# Patient Record
Sex: Female | Born: 1950 | Race: White | Hispanic: No | Marital: Single
Health system: Midwestern US, Academic
[De-identification: ages and names within clinical notes are randomized; demographics above are authoritative.]

---

## 2018-12-16 ENCOUNTER — Encounter: Admit: 2018-12-16 | Discharge: 2018-12-17 | Payer: MEDICARE

## 2018-12-16 DIAGNOSIS — R69 Illness, unspecified: Secondary | ICD-10-CM

## 2019-01-14 ENCOUNTER — Encounter: Admit: 2019-01-14 | Discharge: 2019-01-14 | Payer: MEDICARE

## 2019-01-15 ENCOUNTER — Encounter: Admit: 2019-01-15 | Discharge: 2019-01-15 | Payer: MEDICARE

## 2019-01-15 NOTE — Telephone Encounter
Pre Visit Planning- New Patient    Records received: Yes    Orders have been NA    Patient active in MyChart. No appointment reminder sent. .     Left voicemail message.    Updated chart: Not assessed    MRI and X-ray PACS

## 2019-01-20 ENCOUNTER — Encounter: Admit: 2019-01-20 | Discharge: 2019-01-20 | Payer: MEDICARE

## 2019-01-20 ENCOUNTER — Ambulatory Visit: Admit: 2019-01-20 | Discharge: 2019-01-21 | Payer: MEDICARE

## 2019-01-20 NOTE — Progress Notes
Date of Service: 01/20/2019        Chief complaint    Chief Complaint   Patient presents with   ? Lower Back - Pain   ? Left Leg - Numbness, Pain   ? Right Leg - Numbness, Pain   ? New Patient     muscle stifness, trouble walking and going up and down the stairs       HPI  Chelsea Roach is a 68 y.o. female who presents with a history of progressive back and leg pain. She reports back and leg pain for several years. She reports that the pain in her back is worse with walking and relieved with rest. She complains of bilateral leg pain. She has cramping in her posterior thighs and calves with walking and at night. She reports that the pain is a dull ache. She also complains of difficulty walking up flights of stairs and says that her right leg/knee buckles while walking.     Patient takes tylenol/NSAIDs with mild relief of neck pain.   Patient has tried physical therapy with mild, but temporary relief of neck pain.   Patient does not have a history of diabetes. Patient does not smoke.  Patient does not have a history of cancer/chemotherapy. Patient does not have a history of peripheral arterial disease.   Patient has a BMI of Body mass index is 42.57 kg/m?Marland Kitchen    PMH    Medical History:   Diagnosis Date   ? Lymphedema    ? Spinal stenosis            ROS  Review of Systems   Constitutional: Positive for fatigue.   HENT: Negative.    Eyes: Negative.    Respiratory: Positive for shortness of breath.    Cardiovascular: Negative.    Gastrointestinal: Positive for abdominal pain.   Endocrine: Negative.    Genitourinary: Positive for enuresis.   Musculoskeletal: Positive for arthralgias, back pain and myalgias.   Skin: Negative.    Allergic/Immunologic: Negative.    Neurological: Positive for weakness and numbness.   Hematological: Negative.    Psychiatric/Behavioral: Positive for sleep disturbance.          FH    Family History   Problem Relation Age of Onset   ? Cancer-Breast Mother      Social History Socioeconomic History   ? Marital status: Single     Spouse name: Not on file   ? Number of children: Not on file   ? Years of education: Not on file   ? Highest education level: Not on file   Occupational History   ? Not on file   Tobacco Use   ? Smoking status: Never Smoker   ? Smokeless tobacco: Never Used   Substance and Sexual Activity   ? Alcohol use: Yes     Alcohol/week: 1.0 standard drinks     Types: 1 Glasses of wine per week     Frequency: 2-3 times a week     Binge frequency: Weekly   ? Drug use: Never   ? Sexual activity: Not Currently   Other Topics Concern   ? Not on file   Social History Narrative   ? Not on file           [facility]    Surgical History:   Procedure Laterality Date   ? CESAREAN SECTION  1991   ? 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 REVISION TOTAL KNEE ARTHROPLASTY Right 06/2018   ?  CHOLECYSTECTOMY         Meds    ? liothyronine (CYTOMEL) 5 mcg tab Take 5 mcg by mouth twice daily.   ? SYNTHROID 125 mcg tablet Take 125 mcg by mouth daily.   ? thiamine (VITAMIN B-1) 100 mg/mL injection INJECT 100MG  INTRAMUSCULARLY EVERY 3 WEEKS       Allergies    Allergies   Allergen Reactions   ? Keflex [Cephalexin] SEE COMMENTS     Patient states tightening in throat   ? Pcn [Penicillins] SEE COMMENTS     Patient states tightening in throat     ? Peanut UNKNOWN and SEE COMMENTS     Skin breaks out   ? Shellfish Containing Products NAUSEA AND VOMITING   ? Wheat UNKNOWN         Exam  Physical Exam   Deferred due to telehealth    Vitals:   Vitals:    01/20/19 1329   PainSc: Seven     There is no height or weight on file to calculate BMI.      Imaging:  No results found for this or any previous visit.  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 Risks/benefits/alternatives to surgery discussed with the patient: L3-5 decompression and fusion  Given her spondylolisthesis at L3/4 and complaint of significant LBP she will need stabilization of her spine  Pt with BMI of 43, consideration of XLIF will depend on body habitus  Recommend trial of conservative treatment with injections at L3/4 and L4/5  RTC in 6 weeks    The risks and benefits of surgery were explained in detail to the patient which included, but certainly were not limited to: bleeding, infection, nerve or vessel damage, scar, pain, risks of anesthesia, death, need for further surgery, iatrogenic instability, epidural hematoma, failure of implants and hardware, misplacement of implants and hardware, migration of implants and hardware, bladder injury, bowel injury, heart attack, stroke, massive bleeding, death, deep venous thrombosis, pulmonary embolism, spinal cord and nerve damage, reflex sympathetic dystrophy, sexual dysfunction, positioning problems, brachial plexus injuries, traction injuries, swallowing difficulties, problems with vocal cords, airway obstruction, post operative swelling, need for prolonged intubation, and persistent dural fistula, need for further surgery, paralysis, blindness, possible wrong level surgery, no relief of current symptoms, possible development of new symptoms, possible worsening of symptoms, possible need for intraoperative change of procedure, possible need for fusion of the spine as determined intraoperatively, and other rare risks not named above. The patient also understands that spine fellows and neurosurgery residents will assist with the surgery. The risks, benefits, alternatives and risk of the alternatives were discussed with the patient. The patient understands the risks of the procedure and elect to proceed.      Plan                                Encounter Medications   Medications   ? DISCONTD: BD LUER-LOK SYRINGE 3 mL 25 gauge x 1 syrg Sig: USE ONE SYRINGE ONCE EVERY 3 WEEKS TO INJECT THIAMINE. USE NEW SYRINGE FOR EACH INJECTION.   ? SYNTHROID 125 mcg tablet     Sig: Take 125 mcg by mouth daily.   ? liothyronine (CYTOMEL) 5 mcg  tab     Sig: Take 5 mcg by mouth twice daily.   ? thiamine (VITAMIN B-1) 100 mg/mL injection     Sig: INJECT 100MG  INTRAMUSCULARLY EVERY 3 WEEKS

## 2019-01-20 NOTE — Patient Instructions
Kodee Drury RN, BSN  Clinical Nurse Coordinator-[facility] - [facility]  [facility] - Neurosurgery- Dr. Justin Davis & Dr Ifije Ohiorhenuan  Ph: [phone] - Fax: [phone] - Email:[email or web address]

## 2019-06-10 ENCOUNTER — Encounter: Admit: 2019-06-10 | Discharge: 2019-06-10 | Payer: MEDICARE

## 2019-07-14 ENCOUNTER — Ambulatory Visit: Admit: 2019-07-14 | Discharge: 2019-07-14 | Payer: MEDICARE

## 2019-07-14 ENCOUNTER — Encounter: Admit: 2019-07-14 | Discharge: 2019-07-14 | Payer: MEDICARE

## 2019-07-14 DIAGNOSIS — M4316 Spondylolisthesis, lumbar region: Secondary | ICD-10-CM

## 2019-07-14 DIAGNOSIS — Z20822 Encounter for screening laboratory testing for COVID-19 virus in asymptomatic patient: Secondary | ICD-10-CM

## 2019-07-14 DIAGNOSIS — M4800 Spinal stenosis, site unspecified: Secondary | ICD-10-CM

## 2019-07-14 DIAGNOSIS — I890 Lymphedema, not elsewhere classified: Secondary | ICD-10-CM

## 2019-07-14 DIAGNOSIS — M4306 Spondylolysis, lumbar region: Secondary | ICD-10-CM

## 2019-07-15 ENCOUNTER — Encounter: Admit: 2019-07-15 | Discharge: 2019-07-15 | Payer: MEDICARE

## 2019-07-16 ENCOUNTER — Encounter: Admit: 2019-07-16 | Discharge: 2019-07-16 | Payer: MEDICARE

## 2019-07-16 NOTE — Telephone Encounter
This RN received call from [facility] and the lab states that they do not do COVID PCR testa from an outside ordering Provider. This RN called and spoke to Revonda Standard, Engineer, civil (consulting) at [facility], PA's office and she recommended this RN call patient and request that she call and schedule an in-person appointment for a COVID PCR test and have them fax results to our fax #[phone].   This RN called patient and explained above to her the above and provided her with the above fax # to have results faxed to; This RN informed patient that she needs to get her COVID PCR test on 08/20/2019; as well as informing patient that her Telehealth PAT Appointment is on 07/27/19 at 1130patient verbalized understanding and denies any questions, concerns or needs at this time.

## 2019-07-28 ENCOUNTER — Encounter: Admit: 2019-07-28 | Discharge: 2019-07-28 | Payer: MEDICARE

## 2019-07-28 DIAGNOSIS — G4733 Obstructive sleep apnea (adult) (pediatric): Secondary | ICD-10-CM

## 2019-07-28 DIAGNOSIS — M4800 Spinal stenosis, site unspecified: Secondary | ICD-10-CM

## 2019-07-28 DIAGNOSIS — I890 Lymphedema, not elsewhere classified: Secondary | ICD-10-CM

## 2019-07-28 DIAGNOSIS — E039 Hypothyroidism, unspecified: Secondary | ICD-10-CM

## 2019-07-29 ENCOUNTER — Ambulatory Visit: Admit: 2019-07-29 | Discharge: 2019-07-30 | Payer: MEDICARE

## 2019-07-29 ENCOUNTER — Encounter: Admit: 2019-07-29 | Discharge: 2019-07-29 | Payer: MEDICARE

## 2019-07-29 DIAGNOSIS — Z01818 Encounter for other preprocedural examination: Secondary | ICD-10-CM

## 2019-07-29 DIAGNOSIS — I890 Lymphedema, not elsewhere classified: Secondary | ICD-10-CM

## 2019-07-29 DIAGNOSIS — G4733 Obstructive sleep apnea (adult) (pediatric): Secondary | ICD-10-CM

## 2019-07-29 DIAGNOSIS — M4800 Spinal stenosis, site unspecified: Secondary | ICD-10-CM

## 2019-07-29 DIAGNOSIS — E039 Hypothyroidism, unspecified: Secondary | ICD-10-CM

## 2019-07-29 DIAGNOSIS — Z8719 Personal history of other diseases of the digestive system: Secondary | ICD-10-CM

## 2019-07-29 NOTE — Progress Notes
A telemedicine encounter was created for the patient. Patient consents to the telehealth visit.  Nursing and surgery history were obtained.  Patient was given surgery and soap instructions and instructions were sent via My Chart.  COVID19 visitor policy was reviewed.  Patient verbalized understanding.

## 2019-08-24 MED ORDER — HYDROMORPHONE (PF) 2 MG/ML IJ SYRG
.5 mg | INTRAVENOUS | 0 refills | Status: DC | PRN
Start: 2019-08-24 — End: 2019-08-25
  Administered 2019-08-25: 0.5 mg via INTRAVENOUS

## 2019-08-24 MED ORDER — SODIUM CHLORIDE 0.9 % IV SOLP
1000 mL | INTRAVENOUS | 0 refills | Status: DC
Start: 2019-08-24 — End: 2019-08-25

## 2019-08-24 MED ORDER — MELATONIN 5 MG PO TAB
5 mg | Freq: Every evening | ORAL | 0 refills | Status: DC
Start: 2019-08-24 — End: 2019-08-27
  Administered 2019-08-25 – 2019-08-27 (×3): 5 mg via ORAL

## 2019-08-24 MED ORDER — KETAMINE 10 MG/ML IJ SOLN (INFUSION)(AM)(OR)
0 refills | Status: DC
Start: 2019-08-24 — End: 2019-08-24
  Administered 2019-08-24: 16:00:00 200 ug/kg/h via INTRAVENOUS

## 2019-08-24 MED ORDER — CEFAZOLIN INJ 1GM IVP
2 g | INTRAVENOUS | 0 refills | Status: CP
Start: 2019-08-24 — End: ?
  Administered 2019-08-25 (×3): 2 g via INTRAVENOUS

## 2019-08-24 MED ORDER — EPINEPHRINE 0.1MG NS 10ML SYR (AM)(OR)
0 refills | Status: DC
Start: 2019-08-24 — End: 2019-08-24
  Administered 2019-08-24 (×2): 10 ug via INTRAVENOUS

## 2019-08-24 MED ORDER — LIDOCAINE (PF) 10 MG/ML (1 %) IJ SOLN
.1-2 mL | INTRAMUSCULAR | 0 refills | Status: DC | PRN
Start: 2019-08-24 — End: 2019-08-25

## 2019-08-24 MED ORDER — ROCURONIUM 10 MG/ML IV SOLN
INTRAVENOUS | 0 refills | Status: DC
Start: 2019-08-24 — End: 2019-08-24
  Administered 2019-08-24: 16:00:00 50 mg via INTRAVENOUS

## 2019-08-24 MED ORDER — CETIRIZINE 10 MG PO TAB
10 mg | Freq: Every evening | ORAL | 0 refills | Status: DC
Start: 2019-08-24 — End: 2019-08-27
  Administered 2019-08-25 – 2019-08-27 (×3): 10 mg via ORAL

## 2019-08-24 MED ORDER — SUFENTANIL 100 MCG IN NS 10 ML (OR)
0 refills | Status: DC
Start: 2019-08-24 — End: 2019-08-24
  Administered 2019-08-24 (×2): 0.2 ug/kg/h via INTRAVENOUS

## 2019-08-24 MED ORDER — METHOCARBAMOL 750 MG PO TAB
750 mg | Freq: Three times a day (TID) | ORAL | 0 refills | Status: DC
Start: 2019-08-24 — End: 2019-08-27
  Administered 2019-08-25 – 2019-08-27 (×9): 750 mg via ORAL

## 2019-08-24 MED ORDER — CEFAZOLIN 1 GRAM IJ SOLR
0 refills | Status: DC
Start: 2019-08-24 — End: 2019-08-24
  Administered 2019-08-24 (×2): 3 g via INTRAVENOUS

## 2019-08-24 MED ORDER — ALBUMIN, HUMAN 5 % 500 ML IV SOLP (AN)(OSM)
0 refills | Status: DC
Start: 2019-08-24 — End: 2019-08-24
  Administered 2019-08-24 (×2): via INTRAVENOUS

## 2019-08-24 MED ORDER — ACETAMINOPHEN 1,000 MG/100 ML (10 MG/ML) IV SOLN
0 refills | Status: DC
Start: 2019-08-24 — End: 2019-08-24
  Administered 2019-08-24: 22:00:00 1000 mg via INTRAVENOUS

## 2019-08-24 MED ORDER — PROMETHAZINE 25 MG/ML IJ SOLN
6.25 mg | INTRAVENOUS | 0 refills | Status: DC | PRN
Start: 2019-08-24 — End: 2019-08-25

## 2019-08-24 MED ORDER — VANCOMYCIN 1,000 MG IV SOLR
0 refills | Status: DC
Start: 2019-08-24 — End: 2019-08-24
  Administered 2019-08-24: 21:00:00 1 g via TOPICAL

## 2019-08-24 MED ORDER — SENNOSIDES-DOCUSATE SODIUM 8.6-50 MG PO TAB
1 | Freq: Two times a day (BID) | ORAL | 0 refills | Status: DC
Start: 2019-08-24 — End: 2019-08-27
  Administered 2019-08-25 – 2019-08-27 (×6): 1 via ORAL

## 2019-08-24 MED ORDER — DIAZEPAM 5 MG PO TAB
2.5-5 mg | ORAL | 0 refills | Status: DC | PRN
Start: 2019-08-24 — End: 2019-08-27

## 2019-08-24 MED ORDER — FENTANYL CITRATE (PF) 50 MCG/ML IJ SOLN
50 ug | INTRAVENOUS | 0 refills | Status: DC | PRN
Start: 2019-08-24 — End: 2019-08-25
  Administered 2019-08-24 – 2019-08-25 (×3): 50 ug via INTRAVENOUS

## 2019-08-24 MED ORDER — ACETAMINOPHEN 500 MG PO TAB
1000 mg | Freq: Once | ORAL | 0 refills | Status: DC
Start: 2019-08-24 — End: 2019-08-25

## 2019-08-24 MED ORDER — EPHEDRINE SULFATE 50 MG/ML IV SOLN
0 refills | Status: DC
Start: 2019-08-24 — End: 2019-08-24
  Administered 2019-08-24: 19:00:00 5 mg via INTRAVENOUS
  Administered 2019-08-24: 16:00:00 20 mg via INTRAVENOUS

## 2019-08-24 MED ORDER — FENTANYL CITRATE (PF) 50 MCG/ML IJ SOLN
0 refills | Status: DC
Start: 2019-08-24 — End: 2019-08-24
  Administered 2019-08-24: 16:00:00 100 ug via INTRAVENOUS

## 2019-08-24 MED ORDER — LEVOTHYROXINE 125 MCG PO TAB
125 ug | Freq: Every day | ORAL | 0 refills | Status: DC
Start: 2019-08-24 — End: 2019-08-27
  Administered 2019-08-25 – 2019-08-27 (×3): 125 ug via ORAL

## 2019-08-24 MED ORDER — FENTANYL CITRATE (PF) 50 MCG/ML IJ SOLN
25-50 ug | INTRAVENOUS | 0 refills | Status: DC | PRN
Start: 2019-08-24 — End: 2019-08-26

## 2019-08-24 MED ORDER — LIDOCAINE (PF) 200 MG/10 ML (2 %) IJ SYRG
0 refills | Status: DC
Start: 2019-08-24 — End: 2019-08-24
  Administered 2019-08-24: 16:00:00 80 mg via INTRAVENOUS

## 2019-08-24 MED ORDER — DOCUSATE SODIUM 100 MG PO CAP
100 mg | Freq: Two times a day (BID) | ORAL | 0 refills | Status: DC
Start: 2019-08-24 — End: 2019-08-27
  Administered 2019-08-25 – 2019-08-27 (×6): 100 mg via ORAL

## 2019-08-24 MED ORDER — MAGNESIUM HYDROXIDE 2,400 MG/10 ML PO SUSP
10 mL | Freq: Every day | ORAL | 0 refills | Status: DC
Start: 2019-08-24 — End: 2019-08-27
  Administered 2019-08-25 – 2019-08-27 (×3): 10 mL via ORAL

## 2019-08-24 MED ORDER — DEXAMETHASONE SODIUM PHOSPHATE 4 MG/ML IJ SOLN
INTRAVENOUS | 0 refills | Status: DC
Start: 2019-08-24 — End: 2019-08-24
  Administered 2019-08-24: 16:00:00 4 mg via INTRAVENOUS

## 2019-08-24 MED ORDER — OXYCODONE 5 MG PO TAB
5-10 mg | Freq: Once | ORAL | 0 refills | Status: DC | PRN
Start: 2019-08-24 — End: 2019-08-25

## 2019-08-24 MED ORDER — ELECTROLYTE-A IV SOLP
0 refills | Status: DC
Start: 2019-08-24 — End: 2019-08-24
  Administered 2019-08-24 (×3): via INTRAVENOUS

## 2019-08-24 MED ORDER — ONDANSETRON HCL (PF) 4 MG/2 ML IJ SOLN
INTRAVENOUS | 0 refills | Status: DC
Start: 2019-08-24 — End: 2019-08-24
  Administered 2019-08-24: 23:00:00 4 mg via INTRAVENOUS

## 2019-08-24 MED ORDER — ACETAMINOPHEN 325 MG PO TAB
650 mg | ORAL | 0 refills | Status: DC
Start: 2019-08-24 — End: 2019-08-27
  Administered 2019-08-25 – 2019-08-27 (×11): 650 mg via ORAL

## 2019-08-24 MED ORDER — CEFAZOLIN 1GM NS IRR
0 refills | Status: DC
Start: 2019-08-24 — End: 2019-08-24
  Administered 2019-08-24 (×2): 1000 mL

## 2019-08-24 MED ORDER — OXYBUTYNIN CHLORIDE 10 MG PO TR24
10 mg | ORAL | 0 refills | Status: DC
Start: 2019-08-24 — End: 2019-08-27
  Administered 2019-08-25: 14:00:00 10 mg via ORAL

## 2019-08-24 MED ORDER — LACTATED RINGERS IV SOLP
0 refills | Status: DC
Start: 2019-08-24 — End: 2019-08-24
  Administered 2019-08-24 (×2): via INTRAVENOUS

## 2019-08-24 MED ORDER — ARTIFICIAL TEARS SINGLE DOSE DROPS GROUP
0 refills | Status: DC
Start: 2019-08-24 — End: 2019-08-24
  Administered 2019-08-24: 16:00:00 2 [drp] via OPHTHALMIC

## 2019-08-24 MED ORDER — BUPIVACAINE-EPINEPHRINE 0.5 %-1:200,000 IJ SOLN
0 refills | Status: DC
Start: 2019-08-24 — End: 2019-08-24
  Administered 2019-08-24: 17:00:00 10 mL via INTRAMUSCULAR

## 2019-08-24 MED ORDER — THROMBIN (BOVINE) 5,000 UNIT TP SOLR
0 refills | Status: DC
Start: 2019-08-24 — End: 2019-08-24
  Administered 2019-08-24: 21:00:00 5000 [IU] via TOPICAL

## 2019-08-24 MED ORDER — PROPOFOL INJ 10 MG/ML IV VIAL
0 refills | Status: DC
Start: 2019-08-24 — End: 2019-08-24
  Administered 2019-08-24: 16:00:00 170 mg via INTRAVENOUS
  Administered 2019-08-24: 17:00:00 30 mg via INTRAVENOUS

## 2019-08-24 MED ORDER — PHENYLEPHRINE HCL IN 0.9% NACL 1 MG/10 ML (100 MCG/ML) IV SYRG
INTRAVENOUS | 0 refills | Status: DC
Start: 2019-08-24 — End: 2019-08-24
  Administered 2019-08-24 (×2): 100 ug via INTRAVENOUS
  Administered 2019-08-24: 21:00:00 50 ug via INTRAVENOUS
  Administered 2019-08-24 (×3): 100 ug via INTRAVENOUS

## 2019-08-24 MED ORDER — PROPOFOL 10 MG/ML IV EMUL 100 ML (INFUSION)(AM)(OR)
0 refills | Status: DC
Start: 2019-08-24 — End: 2019-08-24
  Administered 2019-08-24: 16:00:00 125 ug/kg/min via INTRAVENOUS

## 2019-08-24 MED ORDER — ONDANSETRON HCL (PF) 4 MG/2 ML IJ SOLN
4 mg | INTRAVENOUS | 0 refills | Status: DC | PRN
Start: 2019-08-24 — End: 2019-08-27

## 2019-08-24 MED ORDER — OXYCODONE 5 MG PO TAB
5-10 mg | ORAL | 0 refills | Status: DC | PRN
Start: 2019-08-24 — End: 2019-08-25
  Administered 2019-08-25 (×2): 10 mg via ORAL

## 2019-08-24 MED ORDER — LIOTHYRONINE 5 MCG PO TAB
5 ug | Freq: Two times a day (BID) | ORAL | 0 refills | Status: DC
Start: 2019-08-24 — End: 2019-08-27
  Administered 2019-08-25 – 2019-08-27 (×6): 5 ug via ORAL

## 2019-08-24 MED ORDER — BISACODYL 10 MG RE SUPP
10 mg | Freq: Every day | RECTAL | 0 refills | Status: DC
Start: 2019-08-24 — End: 2019-08-27

## 2019-08-24 MED ORDER — PHENYLEPHRINE 40 MCG/ML IN NS IV DRIP (STD CONC)
0 refills | Status: DC
Start: 2019-08-24 — End: 2019-08-24
  Administered 2019-08-24 (×2): .2 ug/kg/min via INTRAVENOUS

## 2019-08-24 MED ADMIN — SODIUM CHLORIDE 0.9 % IV SOLP [27838]: 1000 mL | INTRAVENOUS | @ 15:00:00 | Stop: 2019-08-24 | NDC 00338004904

## 2019-08-25 MED ORDER — HEPARIN, PORCINE (PF) 5,000 UNIT/0.5 ML IJ SYRG
5000 [IU] | SUBCUTANEOUS | 0 refills | Status: DC
Start: 2019-08-25 — End: 2019-08-27
  Administered 2019-08-26 – 2019-08-27 (×6): 5000 [IU] via SUBCUTANEOUS

## 2019-08-25 MED ORDER — OXYCODONE 5 MG PO TAB
5-10 mg | ORAL | 0 refills | Status: DC | PRN
Start: 2019-08-25 — End: 2019-08-27
  Administered 2019-08-26 (×3): 5 mg via ORAL
  Administered 2019-08-26: 23:00:00 10 mg via ORAL
  Administered 2019-08-27: 19:00:00 5 mg via ORAL

## 2019-08-25 MED ORDER — SODIUM CHLORIDE 0.9 % IV SOLP
INTRAVENOUS | 0 refills | Status: DC
Start: 2019-08-25 — End: 2019-08-26
  Administered 2019-08-25 – 2019-08-26 (×2): 1000.000 mL via INTRAVENOUS

## 2019-08-26 MED ORDER — LIDOCAINE 4 % TP CREA
Freq: Once | TOPICAL | 0 refills | Status: AC
Start: 2019-08-26 — End: ?

## 2019-08-27 MED ORDER — METHOCARBAMOL 750 MG PO TAB
750 mg | ORAL_TABLET | Freq: Three times a day (TID) | ORAL | 0 refills | Status: AC
Start: 2019-08-27 — End: ?

## 2019-08-27 MED ORDER — HEPARIN, PORCINE (PF) 5,000 UNIT/0.5 ML IJ SYRG
5000 [IU] | SUBCUTANEOUS | 0 refills | Status: AC
Start: 2019-08-27 — End: ?

## 2019-08-27 MED ORDER — DOCUSATE SODIUM 100 MG PO CAP
100 mg | ORAL_CAPSULE | Freq: Two times a day (BID) | ORAL | 0 refills | Status: AC
Start: 2019-08-27 — End: ?

## 2019-08-27 MED ORDER — SENNOSIDES-DOCUSATE SODIUM 8.6-50 MG PO TAB
1 | Freq: Two times a day (BID) | ORAL | 0 refills | Status: AC
Start: 2019-08-27 — End: ?

## 2019-08-27 MED ORDER — ACETAMINOPHEN 325 MG PO TAB
650 mg | ORAL | 0 refills | Status: AC | PRN
Start: 2019-08-27 — End: ?

## 2019-08-27 MED ORDER — FERROUS SULFATE 325 MG (65 MG IRON) PO TAB
325 mg | Freq: Every day | ORAL | 0 refills | Status: DC
Start: 2019-08-27 — End: 2019-08-27
  Administered 2019-08-27: 19:00:00 325 mg via ORAL

## 2019-08-27 MED ORDER — FERROUS SULFATE 325 MG (65 MG IRON) PO TAB
325 mg | ORAL_TABLET | Freq: Every day | ORAL | 0 refills | Status: AC
Start: 2019-08-27 — End: ?

## 2019-08-27 MED ORDER — OXYCODONE 5 MG PO TAB
5-10 mg | ORAL | 0 refills | 6.00000 days | Status: AC | PRN
Start: 2019-08-27 — End: ?

## 2019-09-01 ENCOUNTER — Encounter: Admit: 2019-09-01 | Discharge: 2019-09-01 | Payer: MEDICARE

## 2019-09-01 NOTE — Telephone Encounter
Spoke with RN at [facility] regarding if Silverlon dressing was removed yesterday per discharge orders, RN confirms it was removed, there are concerns with incision.  RN states it does not look great there are several areas of discoloration/black/red areas along incision line.  They have a wound RN who is seeing a patient now but may be able to provide a picture of wound, RN provided her number [phone] and advised to call her before 5 PM for update on wound.   Spoke with wound RN Terri who states Silverlon was removed yesterday and confirms wound is concerning.  She reports nursing staff did not cover dressing with showering as ordered and feels water may have gotten in dressing causing issues.  She states the distal part of incision is healed well, however 5-6 cm in middle of incision where back divet is deepest, there is black eschar present a crack in skin where it looks like skin is not debriding or sloughing off.  RN denies any dehiscence, drainage, warmth/redness to incision line.  Wound RN got permission from her boss to send [place] pictures, they will be sent via email to RN Marchelle Folks in [facility] for review.  Writer will follow up on pictures with RN Marchelle Folks.      Kem Kays, RN  Clinical Nurse Coordinator  Neurosurgery  [phone]

## 2019-09-07 ENCOUNTER — Encounter: Admit: 2019-09-07 | Discharge: 2019-09-07 | Payer: MEDICARE

## 2019-09-07 ENCOUNTER — Ambulatory Visit: Admit: 2019-09-07 | Discharge: 2019-09-08 | Payer: MEDICARE

## 2019-09-07 DIAGNOSIS — Z8719 Personal history of other diseases of the digestive system: Secondary | ICD-10-CM

## 2019-09-07 DIAGNOSIS — M532X6 Spinal instabilities, lumbar region: Secondary | ICD-10-CM

## 2019-09-07 DIAGNOSIS — I890 Lymphedema, not elsewhere classified: Secondary | ICD-10-CM

## 2019-09-07 DIAGNOSIS — G4733 Obstructive sleep apnea (adult) (pediatric): Secondary | ICD-10-CM

## 2019-09-07 DIAGNOSIS — E039 Hypothyroidism, unspecified: Secondary | ICD-10-CM

## 2019-09-07 DIAGNOSIS — M4800 Spinal stenosis, site unspecified: Secondary | ICD-10-CM

## 2019-09-07 NOTE — Progress Notes
09/07/2019      Obtained patient's verbal consent to treat them and their agreement to [facility] financial policy and NPP via this telehealth visit during the [facility] Emergency    Patient:             Chelsea Roach  Med Rec #: [id number]  DOB:              02-02-1951    There were no vitals filed for this visit.     Subjective:  Jaleia Hanke is a 69 y.o. female who underwent an L3 to L5 laminectomy and L3-L4 TLIF and L4-L5 TILF a posterior instrumentation from L3 to L5, correction spondylolisthesis on 08/24/2019.     History of Present Illness:  This patient had a history of low back pain, bilateral leg pain, found to have grade 1 spondylolisthesis at L3-L4, degenerative changes at L4-L5 with severe stenosis.     Cherisse says she is feeling okay. She feels that it is too early to tell if anything is better. She is struggling to get up and down and does not feel she can say if anything is different. She is eating and drinking well. She denies constipation. She does report having pain in her lower back.     She reports there was noted wound separation in the upper middle aspect of wound that was examined [facility] nurse [facility] the [facility].  She has been using a silverlon dressing.  SHe has been doing this with the assistance of her son.     Allergies as of 09/07/2019 - Reviewed 09/07/2019   Allergen Reaction Noted   ? Wheat RASH, MENTAL STATUS CHANGES, and SEE COMMENTS 01/20/2019   ? Keflex [cephalexin] SEE COMMENTS 01/20/2019   ? Pcn [penicillins] SEE COMMENTS 01/20/2019   ? Peanut UNKNOWN and SEE COMMENTS 01/20/2019   ? Pork derived (porcine) DIZZINESS 07/29/2019   ? Shellfish containing products NAUSEA AND VOMITING 04/03/2014       ? acetaminophen (TYLENOL) 325 mg tablet Take two tablets by mouth every 6 hours as needed for Pain.   ? cetirizine (ZYRTEC) 10 mg tablet Take 10 mg by mouth at bedtime daily.   ? cholecalciferol (VITAMIN D-3) 5000 unit tablet Take 1,000 Units by mouth daily.   ? docusate (COLACE) 100 mg capsule Take one capsule by mouth twice daily.   ? ferrous sulfate (FEOSOL) 325 mg (65 mg iron) tablet Take one tablet by mouth daily. Take on an empty stomach at least 1 hour before or 2 hours after food.   ? fish oil /omega-3 fatty acids (SEA-OMEGA) 340/1000 mg capsule Take 1 capsule by mouth daily.   ? heparin (porcine) PF 5,000units/0.81mL injection syringe Inject 0.5 mL under the skin every 8 hours. DVT prophylaxis until mobilizing well.   ? liothyronine (CYTOMEL) 5 mcg tab Take 5 mcg by mouth twice daily.   ? Magnesium 200 mg tab Take 1 tablet by mouth daily.   ? melatonin 5 mg tab Take 5 mg by mouth at bedtime daily.   ? methocarbamoL (ROBAXIN) 750 mg tablet Take one tablet by mouth three times daily.   ? oxybutynin XL (DITROPAN XL) 10 mg tablet Take 10 mg by mouth every 48 hours.   ? oxyCODONE (ROXICODONE) 5 mg tablet Take one tablet to two tablets by mouth every 4 hours as needed Indications: pain   ? senna/docusate (SENOKOT-S) 8.6/50 mg tablet Take one tablet by mouth twice daily. Indications: constipation   ?  SYNTHROID 125 mcg tablet Take 125 mcg by mouth daily.   ? thiamine (VITAMIN B-1) 100 mg/mL injection INJECT 100MG  INTRAMUSCULARLY EVERY 3 WEEKS   ? vitamin A 2,400 mcg capsule Take 8,000 Units by mouth daily.   ? vitamins, multiple cap Take 1 capsule by mouth daily.   ? Zinc Acetate (Oral) 50 mg (zinc) cap Take 1 capsule by mouth daily.        Medical History:   Diagnosis Date   ? Acquired hypothyroidism    ? History of dental problems     attached dental work, not sure what kind   ? Lymphedema    ? OSA (obstructive sleep apnea)    ? Spinal stenosis         Surgical History:   Procedure Laterality Date   ? CESAREAN SECTION  1991   ? 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 REVISION TOTAL KNEE ARTHROPLASTY Right 06/2018   ? LUMBAR 3 LUMBAR 4 AND LUMBAR 4 LUMBAR 5 TRANSFORAMINAL LUMBAR INTERBODY FUSION, OPEN AND LUMBAR 3-5 FUSION N/A 08/24/2019    Performed by Jobe Marker, MD at [facility]   ? 22585--ARTHRODESIS ANTERIOR INTERBODY WITH MINIMAL DISCECTOMY - EACH ADDITIONAL INTERSPACE N/A 08/24/2019    Performed by Jobe Marker, MD at [facility]   ? 22840--POSTERIOR NON-SEGMENTAL INSTRUMENTATION SPINE N/A 08/24/2019    Performed by Jobe Marker, MD at [facility]   ? 22853--INSERTION INTERBODY BIOMECHANICAL DEVICE WITH/ WITHOUT INTEGRAL ANTERIOR INSTRUMENTATION FOR DEVICE ANCHORING TO INTERVERTEBRAL DISC SPACE IN CONJUNCTION WITH INTERBODY ARTHRODESIS - EACH INTERSPACE N/A 08/24/2019    Performed by Jobe Marker, MD at [facility]   ? 22842--POSTERIOR SEGMENTAL INSTRUMENTATION - 3 TO 6 VERTEBRAL SEGMENTS N/A 08/24/2019    Performed by Jobe Marker, MD at [facility]   ? ALLOGRAFT/ MORSELIZED/ PLACEMENT OSTEOPROMOTIVE MATERIAL - SPINE SURGERY ONLY N/A 08/24/2019    Performed by Jobe Marker, MD at [facility]   ? O-ARM/ STEALTH NAVIGATION STEREOTACTIC COMPUTER-ASSISTED PROCEDURE - SPINAL N/A 08/24/2019    Performed by Jobe Marker, MD at [facility]   ? ARTHRODESIS POSTERIOR/ POSTEROLATERAL WITH POSTERIOR INTERBODY WITH LAMINECTOMY/ DISCECTOMY - SINGLE INTERSPACE AND SEGMENT - LUMBAR N/A 08/24/2019    Performed by Jobe Marker, MD at [facility]   ? ARTHRODESIS POSTERIOR/ POSTEROLATERAL WITH POSTERIOR INTERBODY WITH LAMINECTOMY/ DISCECTOMY - SINGLE INTERSPACE AND SEGMENT - EACH ADDITIONAL INTERSPACE AND SEGMENT N/A 08/24/2019    Performed by Jobe Marker, MD at [facility]   ? LAMINECTOMY/ FACETECTOMY/ FORAMINOTOMY WITH DECOMPRESSION - 1 VERTEBRAL SEGMENT - CERVICAL N/A 08/24/2019    Performed by Jobe Marker, MD at [facility]   ? LAMINECTOMY/ FACETECTOMY/ FORAMINOTOMY WITH DECOMPRESSION - 1 VERTEBRAL SEGMENT - EACH ADDITIONAL CERVICAL/ THORACIC/ LUMBAR SEGMENT N/A 08/24/2019    Performed by Jobe Marker, MD at [facility]   ? AUTOGRAFT - SPINE SURGERY ONLY N/A 08/24/2019    Performed by Jobe Marker, MD at [facility]   ? FUSION SPINE POSTERIOR - LUMBAR - EACH ADDITIONAL SEGMENT N/A 08/24/2019    Performed by Jobe Marker, MD at [facility]   ? CHOLECYSTECTOMY     ? HX KNEE ARTHROSCOPY Right     debridement & synovectomy        No flowsheet data found.    Incision: Her incision appears to be healing well. She has been using the Silverlon dressings and the area where she and her son had reported  some wound separation now appears to be healing over without difficulty. There is no redness associated with it.     Physical Exam:  The patient is alert and oriented.  Affect is normal and appropriate.   Patient answers questions appropriately and is conversant.  Speech is clear and fluent. Speech is normal rate and volume without hoarseness.  Facies is symmetric.  EOM intact.  Motor exam is spontaneous and symmetric  She is ambulating independently.     Assessment and Plan:  1. Lumbar spine instability         Skyla Speegle is a 70 y.o. female who underwent an L3 to L5 laminectomy and L3-L4 TLIF and L4-L5 TILF a posterior instrumentation from L3 to L5, correction spondylolisthesis on 08/24/2019.     1.  Please continue your current wound care regimen.  2.  Please continue your current bowel regimen.  3.  Please follow-up as previously scheduled on 10/06/2019 with Dr. Alma Friendly.     Video Time Documentation:  I spent 13:21 minutes reviewing available records and testing as well as with the patient via telehealth with more than 50% of this time spent in counseling and coordination of care.    Kerrin Mo MD  phone. [phone]  fax. [phone]    ATTESTATION  This visit was documented by [employer] via audio recorded by Kerrin Mo, MD on Sep 07, 2019 at 4:27 PM      Dictated but not read.

## 2019-09-07 NOTE — Patient Instructions
1.  Please continue your current wound care regimen.  2.  Please continue your current bowel regimen.  3.  Please follow-up as previously scheduled.

## 2019-10-02 ENCOUNTER — Encounter: Admit: 2019-10-02 | Discharge: 2019-10-02 | Payer: MEDICARE

## 2019-10-02 DIAGNOSIS — G959 Disease of spinal cord, unspecified: Secondary | ICD-10-CM

## 2019-10-02 DIAGNOSIS — Z981 Arthrodesis status: Secondary | ICD-10-CM

## 2019-10-06 ENCOUNTER — Ambulatory Visit: Admit: 2019-10-06 | Discharge: 2019-10-06 | Payer: MEDICARE

## 2019-10-06 ENCOUNTER — Encounter: Admit: 2019-10-06 | Discharge: 2019-10-06 | Payer: MEDICARE

## 2019-10-06 DIAGNOSIS — Z981 Arthrodesis status: Secondary | ICD-10-CM

## 2019-10-06 DIAGNOSIS — E039 Hypothyroidism, unspecified: Secondary | ICD-10-CM

## 2019-10-06 DIAGNOSIS — I890 Lymphedema, not elsewhere classified: Secondary | ICD-10-CM

## 2019-10-06 DIAGNOSIS — M4316 Spondylolisthesis, lumbar region: Secondary | ICD-10-CM

## 2019-10-06 DIAGNOSIS — G4733 Obstructive sleep apnea (adult) (pediatric): Secondary | ICD-10-CM

## 2019-10-06 DIAGNOSIS — M4800 Spinal stenosis, site unspecified: Secondary | ICD-10-CM

## 2019-10-06 DIAGNOSIS — Z8719 Personal history of other diseases of the digestive system: Secondary | ICD-10-CM

## 2019-10-06 NOTE — Patient Instructions
Please call for any questions or concerns.      Thank you,  Genelle Gather, RN, BSN  Ambulatory Clinical Coordinator  [place] [place]. Clydene Pugh, MD, [facility]  [facility]  Phone [phone]  Fax [phone]

## 2019-10-06 NOTE — Progress Notes
Chelsea A. Clydene Pugh, MD [facility]  Follow - Up Visit  Subjective     REASON FOR VISIT   Pain of the Lower Back and Post Operative Visit (6 week)    SUBJECTIVE     69 year old female history of two-level TLIF approximately 6 weeks ago doing very well.  She has significant reduction in her low back pain.  She is able to get up and down flights of stairs with only minor difficulty.  She continues to ambulate with a cane.  She has some chronic urinary incontinence.  I recommend that she follows up with a gynecologist for this.       ROS: Review of Systems   Respiratory: Positive for apnea.    Genitourinary: Positive for enuresis.   Allergic/Immunologic: Positive for food allergies.     A 10-point ROS was performed and negative.    PHYSICAL EXAM   Blood pressure (!) 146/91, pulse 118, temperature 36.9 ?C (98.4 ?F), temperature source Oral, height 172.7 cm (68), weight 115.7 kg (255 lb), SpO2 (!) 91 %.  Body mass index is 38.77 kg/m?Marland Kitchen  Oswestry Total Score:: 50  Pain Score: Three    Constitutional: Alert, NAD  Head: Atraumatic  Eyes: EOMI  Respiratory: Unlabored breathing  Cardiovascular: Regular rate  Skin: No rashes or open wounds appreciated on back  Musculoskeletal: Strength stable  Neurologic: Sensation stable  Incision well-healed      RADIOGRAPHS   Stable hardware position       ASSESSMENT / PLAN     Chelsea Roach is a 69 y.o. female with history of two-level TLIF approximately 6 weeks ago doing very well after surgery  She will start working out at the gym soon  She will see me in 6 weeks with x-rays    No diagnosis found.         Chelsea Maple, MD, PhD  Neurosurgery  Chelsea Buff A. Clydene Pugh MD, [facility]  Nurse: Chelsea Roach, BSN, RN  [phone]  -  [email or web address]

## 2019-11-09 ENCOUNTER — Encounter: Admit: 2019-11-09 | Discharge: 2019-11-09 | Payer: MEDICARE

## 2019-11-09 NOTE — Patient Instructions
It was nice to see you today.  Thank you for choosing to visit our clinic.  Your time is important, and if you had to wait today, we do apologize.  Our goal is to run exactly on time.  However, on occasion, we get behind in clinic due to unexpected patient issues.  Thank you for your patience.    General Instructions:  ? Scheduling:  Our scheduling phone number is [phone].  ? Appointment Reminders on your cell phone:  Make sure we have your cell phone number in your account, and text [place] to [phone].  ? How to reach our office:  Please send a MyChart message to the Spine Center or leave a voicemail for the nurse at [phone].  ? How to get a medication refill:  Please use the MyChart Refill request or contact your pharmacy directly to request medication refills.  Please allow 72 business hours for request to be completed.    ? Support for many chronic illnesses is available through [employer] at [email or web address] or [phone].    ? For help with MyChart:  please call [phone].    ? For questions on nights, weekends or holidays:  call the Operator at [phone], and ask for the doctor on call for Neurosurgery.    ? For more information on spinal conditions:  please visit [email or web address]     Again, thank you for coming in today.       Genelle Gather, RN, BSN  Clinical Nurse Coordinator for Dr. Mauricio Po. Clydene Pugh, MD, [facility]  [facility]  Phone [phone]  Fax [phone]

## 2019-11-11 ENCOUNTER — Encounter: Admit: 2019-11-11 | Discharge: 2019-11-11 | Payer: MEDICARE

## 2019-11-11 DIAGNOSIS — M47816 Spondylosis without myelopathy or radiculopathy, lumbar region: Secondary | ICD-10-CM

## 2019-11-11 DIAGNOSIS — M532X6 Spinal instabilities, lumbar region: Secondary | ICD-10-CM

## 2019-11-11 DIAGNOSIS — Z981 Arthrodesis status: Secondary | ICD-10-CM

## 2019-11-11 DIAGNOSIS — M4316 Spondylolisthesis, lumbar region: Secondary | ICD-10-CM

## 2019-11-11 DIAGNOSIS — M4306 Spondylolysis, lumbar region: Secondary | ICD-10-CM

## 2019-11-17 ENCOUNTER — Ambulatory Visit: Admit: 2019-11-17 | Discharge: 2019-11-17 | Payer: MEDICARE

## 2019-11-17 ENCOUNTER — Encounter: Admit: 2019-11-17 | Discharge: 2019-11-17 | Payer: MEDICARE

## 2019-11-17 DIAGNOSIS — M4316 Spondylolisthesis, lumbar region: Secondary | ICD-10-CM

## 2019-11-17 DIAGNOSIS — I890 Lymphedema, not elsewhere classified: Secondary | ICD-10-CM

## 2019-11-17 DIAGNOSIS — M532X6 Spinal instabilities, lumbar region: Secondary | ICD-10-CM

## 2019-11-17 DIAGNOSIS — M4306 Spondylolysis, lumbar region: Secondary | ICD-10-CM

## 2019-11-17 DIAGNOSIS — M4800 Spinal stenosis, site unspecified: Secondary | ICD-10-CM

## 2019-11-17 DIAGNOSIS — M47816 Spondylosis without myelopathy or radiculopathy, lumbar region: Secondary | ICD-10-CM

## 2019-11-17 DIAGNOSIS — G4733 Obstructive sleep apnea (adult) (pediatric): Secondary | ICD-10-CM

## 2019-11-17 DIAGNOSIS — Z981 Arthrodesis status: Secondary | ICD-10-CM

## 2019-11-17 DIAGNOSIS — Z8719 Personal history of other diseases of the digestive system: Secondary | ICD-10-CM

## 2019-11-17 DIAGNOSIS — E039 Hypothyroidism, unspecified: Secondary | ICD-10-CM

## 2019-11-17 NOTE — Progress Notes
SPINE CENTER CLINIC NOTE       SUBJECTIVE:   69 year old female status post two-level TLIF and fusion approximately 3 months ago.  Overall she is doing very well after surgery.  She has significant improvement in her low back pain.  She has not yet started physical therapy.  She has been compliant with her brace.  She has an upcoming appointment with her gynecologist to discuss her possible treatment of her pelvic floor weakness and incontinence.         Review of Systems   Genitourinary: Positive for enuresis.   Musculoskeletal: Positive for arthralgias, back pain, gait problem and neck pain.   All other systems reviewed and are negative.      Current Outpatient Medications:   ?  acetaminophen (TYLENOL) 325 mg tablet, Take two tablets by mouth every 6 hours as needed for Pain., Disp: , Rfl:   ?  cetirizine (ZYRTEC) 10 mg tablet, Take 10 mg by mouth at bedtime daily., Disp: , Rfl:   ?  cholecalciferol (VITAMIN D-3) 5000 unit tablet, Take 1,000 Units by mouth daily., Disp: , Rfl:   ?  docusate (COLACE) 100 mg capsule, Take one capsule by mouth twice daily., Disp: 180 capsule, Rfl:   ?  ferrous sulfate (FEOSOL) 325 mg (65 mg iron) tablet, Take one tablet by mouth daily. Take on an empty stomach at least 1 hour before or 2 hours after food., Disp: 90 tablet, Rfl:   ?  fish oil /omega-3 fatty acids (SEA-OMEGA) 340/1000 mg capsule, Take 1 capsule by mouth daily., Disp: , Rfl:   ?  heparin (porcine) PF 5,000units/0.27mL injection syringe, Inject 0.5 mL under the skin every 8 hours. DVT prophylaxis until mobilizing well., Disp: , Rfl:   ?  liothyronine (CYTOMEL) 5 mcg tab, Take 5 mcg by mouth twice daily., Disp: , Rfl:   ?  Magnesium 200 mg tab, Take 1 tablet by mouth daily., Disp: , Rfl:   ?  melatonin 5 mg tab, Take 5 mg by mouth at bedtime daily., Disp: , Rfl:   ?  methocarbamoL (ROBAXIN) 750 mg tablet, Take one tablet by mouth three times daily., Disp: 15 tablet, Rfl:   ?  oxybutynin XL (DITROPAN XL) 10 mg tablet, Take 10 mg by mouth every 48 hours., Disp: , Rfl:   ?  oxyCODONE (ROXICODONE) 5 mg tablet, Take one tablet to two tablets by mouth every 4 hours as needed Indications: pain, Disp: , Rfl:   ?  senna/docusate (SENOKOT-S) 8.6/50 mg tablet, Take one tablet by mouth twice daily. Indications: constipation, Disp:  , Rfl:   ?  SYNTHROID 125 mcg tablet, Take 125 mcg by mouth daily., Disp: , Rfl:   ?  thiamine (VITAMIN B-1) 100 mg/mL injection, INJECT 100MG  INTRAMUSCULARLY EVERY 3 WEEKS, Disp: , Rfl:   ?  vitamin A 2,400 mcg capsule, Take 8,000 Units by mouth daily., Disp: , Rfl:   ?  vitamins, multiple cap, Take 1 capsule by mouth daily., Disp: , Rfl:   ?  Zinc Acetate (Oral) 50 mg (zinc) cap, Take 1 capsule by mouth daily., Disp: , Rfl:   Allergies   Allergen Reactions   ? Wheat RASH, MENTAL STATUS CHANGES and SEE COMMENTS     Ear infections stopped once pt got off of wheat   ? Keflex [Cephalexin] SEE COMMENTS     Patient states tightening in throat   ? Pcn [Penicillins] SEE COMMENTS     Patient states tightening in throat     ?  Peanut UNKNOWN and SEE COMMENTS     Skin breaks out   ? Pork Derived (Porcine) DIZZINESS     Light-headedness   ? Shellfish Containing Products NAUSEA AND VOMITING     Physical Exam  Vitals:    11/17/19 1524   Pulse: 94   SpO2: 94%   Weight: 120.1 kg (264 lb 12.8 oz)   Height: 172.7 cm (67.99)   PainSc: Three        Pain Score: Three  Body mass index is 40.27 kg/m?.    A&Ox3  FS, TML, EOMI      D B Tr Hg IO  R 5/5 5/5 5/5 5/5 5/5  L 5/5 5/5 5/5 5/5 5/5        HF KE DF PF EHL  R  5/5 5/5 5/5 5/5 5/5  L  5/5 5/5 5/5 5/5 5/5    Incision well-healed       IMPRESSION:  No diagnosis found.      PLAN:   69 year old female with history of low back pain status post two-level TLIF approximately 3 months ago.  Overall doing very well after surgery.  Significant reduction in her low back pain.  She has not yet started physical therapy.  She will see her gynecologist first.  She return to see me in 3 months with x-rays via telehealth.

## 2020-02-15 ENCOUNTER — Encounter: Admit: 2020-02-15 | Discharge: 2020-02-15 | Payer: MEDICARE

## 2020-02-15 NOTE — Telephone Encounter
This RN returned patient's call and patient has a Telehealth appt 10/26 with Dr. Alma Friendly and patient is inquiring about L-Spine x-ray order to be done at [facility] in [place], [place] before upcoming appt; this RN informed patient that this RN will fax the L-Spine AP and Lateral x-ray to [facility] today with cover sheet requesting x-ray report to be faxed to [phone] and to cloud x-ray images to [facility]. Patient verbalized understanding, voices her appreciation and denies any further questions, concerns or needs at this time. X- ray order faxed to [facility], Attn- Radiology.

## 2020-02-19 NOTE — Patient Instructions
It was nice to see you today.  Thank you for choosing to visit our clinic.  Your time is important, and if you had to wait today, we do apologize.  Our goal is to run exactly on time.  However, on occasion, we get behind in clinic due to unexpected patient issues.  Thank you for your patience.    General Instructions:  ? Scheduling:  Our scheduling phone number is [phone].  ? Appointment Reminders on your cell phone:  Make sure we have your cell phone number in your account, and text [place] to [phone].  ? How to reach our office:  Please send a MyChart message to the Spine Center or leave a voicemail for the nurse at [phone].  ? How to get a medication refill:  Please use the MyChart Refill request or contact your pharmacy directly to request medication refills.  Please allow 72 business hours for request to be completed.    ? Support for many chronic illnesses is available through [employer] at [email or web address] or [phone].    ? For help with MyChart:  please call [phone].    ? For questions on nights, weekends or holidays:  call the Operator at [phone], and ask for the doctor on call for Neurosurgery.    ? For more information on spinal conditions:  please visit [email or web address]     Again, thank you for coming in today.     Genelle Gather, RN, BSN  Clinical Nurse Coordinator for Dr. Mauricio Po. Clydene Pugh, MD, [facility]  [facility]  Phone [phone]  Fax [phone]

## 2020-02-22 ENCOUNTER — Encounter: Admit: 2020-02-22 | Discharge: 2020-02-22 | Payer: MEDICARE

## 2020-02-22 DIAGNOSIS — R69 Illness, unspecified: Secondary | ICD-10-CM

## 2020-02-23 ENCOUNTER — Ambulatory Visit: Admit: 2020-02-23 | Discharge: 2020-02-24 | Payer: MEDICARE

## 2020-02-23 ENCOUNTER — Encounter: Admit: 2020-02-23 | Discharge: 2020-02-22 | Payer: MEDICARE

## 2020-02-23 ENCOUNTER — Encounter: Admit: 2020-02-23 | Discharge: 2020-02-23 | Payer: MEDICARE

## 2020-02-23 DIAGNOSIS — M4316 Spondylolisthesis, lumbar region: Secondary | ICD-10-CM

## 2020-02-23 NOTE — Progress Notes
Date of Service: 02/23/2020        Chief complaint    Chief Complaint   Patient presents with   ? Spine - Pain   ? Follow Up     3 mo FUV       HPI  Chelsea Roach is a 69 y.o. 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  Overall she is doing very well after surgery.  She has significant improvement in her low back pain.  She has not yet started physical therapy but is doing all the exercises at home.      PMH    Medical History:   Diagnosis Date   ? Acquired hypothyroidism    ? History of dental problems     attached dental work, not sure what kind   ? Lymphedema    ? OSA (obstructive sleep apnea)    ? Spinal stenosis            ROS  Review of Systems       FH    Family History   Problem Relation Age of Onset   ? Cancer-Breast Mother      Social History     Socioeconomic History   ? Marital status: Single     Spouse name: Not on file   ? Number of children: Not on file   ? Years of education: Not on file   ? Highest education level: Not on file   Occupational History   ? Not on file   Tobacco Use   ? Smoking status: Never Smoker   ? Smokeless tobacco: Never Used   Vaping Use   ? Vaping Use: Never used   Substance and Sexual Activity   ? Alcohol use: Yes     Alcohol/week: 5.0 standard drinks     Types: 5 Glasses of wine per week   ? Drug use: Never   ? Sexual activity: Not Currently   Other Topics Concern   ? Not on file   Social History Narrative   ? Not on file           [facility]    Surgical History:   Procedure Laterality Date   ? CESAREAN SECTION  1991   ? 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 REVISION TOTAL KNEE ARTHROPLASTY Right 06/2018   ? LUMBAR 3 LUMBAR 4 AND LUMBAR 4 LUMBAR 5 TRANSFORAMINAL LUMBAR INTERBODY FUSION, OPEN AND LUMBAR 3-5 FUSION N/A 08/24/2019    Performed by Jobe Marker, MD at [facility]   ? 22585--ARTHRODESIS ANTERIOR INTERBODY WITH MINIMAL DISCECTOMY - EACH ADDITIONAL INTERSPACE N/A 08/24/2019    Performed by Jobe Marker, MD at [facility]   ? 22840--POSTERIOR NON-SEGMENTAL INSTRUMENTATION SPINE N/A 08/24/2019    Performed by Jobe Marker, MD at [facility]   ? 22853--INSERTION INTERBODY BIOMECHANICAL DEVICE WITH/ WITHOUT INTEGRAL ANTERIOR INSTRUMENTATION FOR DEVICE ANCHORING TO INTERVERTEBRAL DISC SPACE IN CONJUNCTION WITH INTERBODY ARTHRODESIS - EACH INTERSPACE N/A 08/24/2019    Performed by Jobe Marker, MD at [facility]   ? 22842--POSTERIOR SEGMENTAL INSTRUMENTATION - 3 TO 6 VERTEBRAL SEGMENTS N/A 08/24/2019    Performed by Jobe Marker, MD at [facility]   ? ALLOGRAFT/ MORSELIZED/ PLACEMENT OSTEOPROMOTIVE MATERIAL - SPINE SURGERY ONLY N/A 08/24/2019    Performed by Jobe Marker, MD at [facility]   ? O-ARM/ STEALTH NAVIGATION STEREOTACTIC COMPUTER-ASSISTED PROCEDURE - SPINAL N/A 08/24/2019  Performed by Jobe Marker, MD at [facility]   ? ARTHRODESIS POSTERIOR/ POSTEROLATERAL WITH POSTERIOR INTERBODY WITH LAMINECTOMY/ DISCECTOMY - SINGLE INTERSPACE AND SEGMENT - LUMBAR N/A 08/24/2019    Performed by Jobe Marker, MD at [facility]   ? ARTHRODESIS POSTERIOR/ POSTEROLATERAL WITH POSTERIOR INTERBODY WITH LAMINECTOMY/ DISCECTOMY - SINGLE INTERSPACE AND SEGMENT - EACH ADDITIONAL INTERSPACE AND SEGMENT N/A 08/24/2019    Performed by Jobe Marker, MD at [facility]   ? LAMINECTOMY/ FACETECTOMY/ FORAMINOTOMY WITH DECOMPRESSION - 1 VERTEBRAL SEGMENT - CERVICAL N/A 08/24/2019    Performed by Jobe Marker, MD at [facility]   ? LAMINECTOMY/ FACETECTOMY/ FORAMINOTOMY WITH DECOMPRESSION - 1 VERTEBRAL SEGMENT - EACH ADDITIONAL CERVICAL/ THORACIC/ LUMBAR SEGMENT N/A 08/24/2019    Performed by Jobe Marker, MD at [facility]   ? AUTOGRAFT - SPINE SURGERY ONLY N/A 08/24/2019    Performed by Jobe Marker, MD at [facility]   ? FUSION SPINE POSTERIOR - LUMBAR - EACH ADDITIONAL SEGMENT N/A 08/24/2019    Performed by Jobe Marker, MD at [facility]   ? CHOLECYSTECTOMY     ? HX KNEE ARTHROSCOPY Right debridement & synovectomy       Meds    ? acetaminophen (TYLENOL) 325 mg tablet Take two tablets by mouth every 6 hours as needed for Pain.   ? cetirizine (ZYRTEC) 10 mg tablet Take 10 mg by mouth at bedtime daily.   ? cholecalciferol (VITAMIN D-3) 5000 unit tablet Take 1,000 Units by mouth daily.   ? docusate (COLACE) 100 mg capsule Take one capsule by mouth twice daily.   ? ferrous sulfate (FEOSOL) 325 mg (65 mg iron) tablet Take one tablet by mouth daily. Take on an empty stomach at least 1 hour before or 2 hours after food.   ? fish oil /omega-3 fatty acids (SEA-OMEGA) 340/1000 mg capsule Take 1 capsule by mouth daily.   ? heparin (porcine) PF 5,000units/0.29mL injection syringe Inject 0.5 mL under the skin every 8 hours. DVT prophylaxis until mobilizing well.   ? liothyronine (CYTOMEL) 5 mcg tab Take 5 mcg by mouth twice daily.   ? Magnesium 200 mg tab Take 1 tablet by mouth daily.   ? melatonin 5 mg tab Take 5 mg by mouth at bedtime daily.   ? methocarbamoL (ROBAXIN) 750 mg tablet Take one tablet by mouth three times daily.   ? oxybutynin XL (DITROPAN XL) 10 mg tablet Take 10 mg by mouth every 48 hours.   ? oxyCODONE (ROXICODONE) 5 mg tablet Take one tablet to two tablets by mouth every 4 hours as needed Indications: pain   ? senna/docusate (SENOKOT-S) 8.6/50 mg tablet Take one tablet by mouth twice daily. Indications: constipation   ? SYNTHROID 125 mcg tablet Take 125 mcg by mouth daily.   ? thiamine (VITAMIN B-1) 100 mg/mL injection INJECT 100MG  INTRAMUSCULARLY EVERY 3 WEEKS   ? vitamin A 2,400 mcg capsule Take 8,000 Units by mouth daily.   ? vitamins, multiple cap Take 1 capsule by mouth daily.   ? Zinc Acetate (Oral) 50 mg (zinc) cap Take 1 capsule by mouth daily.       Allergies    Allergies   Allergen Reactions   ? Wheat RASH, MENTAL STATUS CHANGES and SEE COMMENTS     Ear infections stopped once pt got off of wheat   ? Keflex [Cephalexin] SEE COMMENTS     Patient states tightening in throat   ? Pcn [Penicillins] SEE  COMMENTS     Patient states tightening in throat     ? Peanut UNKNOWN and SEE COMMENTS     Skin breaks out   ? Pork Derived (Porcine) DIZZINESS     Light-headedness   ? Shellfish Containing Products NAUSEA AND VOMITING         Exam  Physical Exam   Deferred due to telehealth      Vitals:   There were no vitals filed for this visit.  There is no height or weight on file to calculate BMI.      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

## 2021-01-11 ENCOUNTER — Encounter: Admit: 2021-01-11 | Discharge: 2021-01-11 | Payer: MEDICARE

## 2021-01-11 DIAGNOSIS — M79671 Pain in right foot: Secondary | ICD-10-CM

## 2021-01-17 ENCOUNTER — Ambulatory Visit: Admit: 2021-01-17 | Discharge: 2021-01-17 | Payer: MEDICARE

## 2021-01-17 ENCOUNTER — Encounter: Admit: 2021-01-17 | Discharge: 2021-01-17 | Payer: MEDICARE

## 2021-01-17 DIAGNOSIS — I890 Lymphedema, not elsewhere classified: Secondary | ICD-10-CM

## 2021-01-17 DIAGNOSIS — M79671 Pain in right foot: Secondary | ICD-10-CM

## 2021-01-17 DIAGNOSIS — Z8719 Personal history of other diseases of the digestive system: Secondary | ICD-10-CM

## 2021-01-17 DIAGNOSIS — G4733 Obstructive sleep apnea (adult) (pediatric): Secondary | ICD-10-CM

## 2021-01-17 DIAGNOSIS — M4800 Spinal stenosis, site unspecified: Secondary | ICD-10-CM

## 2021-01-17 DIAGNOSIS — E039 Hypothyroidism, unspecified: Secondary | ICD-10-CM

## 2021-01-17 MED ORDER — GABAPENTIN 100 MG PO CAP
100 mg | ORAL_CAPSULE | Freq: Three times a day (TID) | ORAL | 1 refills | Status: AC
Start: 2021-01-17 — End: ?

## 2021-01-17 NOTE — Patient Instructions
It was our pleasure to see you today.  If you need anything further contact our office at [phone]  Luke Thompson MD & Majd Tissue RN at [facility].

## 2021-01-26 ENCOUNTER — Encounter: Admit: 2021-01-26 | Discharge: 2021-01-26 | Payer: MEDICARE

## 2021-02-06 ENCOUNTER — Encounter: Admit: 2021-02-06 | Discharge: 2021-02-06 | Payer: MEDICARE

## 2021-02-08 ENCOUNTER — Ambulatory Visit: Admit: 2021-02-08 | Discharge: 2021-02-09 | Payer: MEDICARE

## 2021-02-08 ENCOUNTER — Encounter: Admit: 2021-02-08 | Discharge: 2021-02-08 | Payer: MEDICARE

## 2021-02-08 DIAGNOSIS — I890 Lymphedema, not elsewhere classified: Secondary | ICD-10-CM

## 2021-02-08 DIAGNOSIS — G4733 Obstructive sleep apnea (adult) (pediatric): Secondary | ICD-10-CM

## 2021-02-08 DIAGNOSIS — Z8719 Personal history of other diseases of the digestive system: Secondary | ICD-10-CM

## 2021-02-08 DIAGNOSIS — M4800 Spinal stenosis, site unspecified: Secondary | ICD-10-CM

## 2021-02-08 DIAGNOSIS — E039 Hypothyroidism, unspecified: Secondary | ICD-10-CM

## 2021-02-09 ENCOUNTER — Encounter: Admit: 2021-02-09 | Discharge: 2021-02-09 | Payer: MEDICARE

## 2021-02-09 NOTE — Telephone Encounter
Spoke th patient and she is having new symptoms in regards to her back. I informed her that she would need to be seen in person for Dr. [place] to assess her and that we would get x-rays at that time. Patient was agreeable to day and time.

## 2021-02-23 ENCOUNTER — Encounter: Admit: 2021-02-23 | Discharge: 2021-02-23 | Payer: MEDICARE

## 2021-02-23 DIAGNOSIS — Z8719 Personal history of other diseases of the digestive system: Secondary | ICD-10-CM

## 2021-02-23 DIAGNOSIS — M4800 Spinal stenosis, site unspecified: Secondary | ICD-10-CM

## 2021-02-23 DIAGNOSIS — E039 Hypothyroidism, unspecified: Secondary | ICD-10-CM

## 2021-02-23 DIAGNOSIS — G4733 Obstructive sleep apnea (adult) (pediatric): Secondary | ICD-10-CM

## 2021-02-23 DIAGNOSIS — I890 Lymphedema, not elsewhere classified: Secondary | ICD-10-CM

## 2021-03-07 ENCOUNTER — Encounter: Admit: 2021-03-07 | Discharge: 2021-03-07 | Payer: MEDICARE

## 2021-03-07 DIAGNOSIS — Z981 Arthrodesis status: Secondary | ICD-10-CM

## 2021-03-07 NOTE — Patient Instructions
It was nice to see you today.  Thank you for choosing to visit our clinic.  Your time is important, and if you had to wait today, we do apologize.  Our goal is to run exactly on time.  However, on occasion, we get behind in clinic due to unexpected patient issues.  Thank you for your patience.    General Instructions:  Scheduling:  Our scheduling phone number is [phone].  How to reach our office:  Please send a MyChart message to the Spine Center or leave a voicemail for the nurse, Caspar Favila, RN, BSN at [phone].  How to get a medication refill:  Please use the MyChart Refill request or contact your pharmacy directly to request medication refills.  Please allow 72 business hours for request to be completed.    Support for many chronic illnesses is available through [facility] at [email or web address] or [phone].    For help with MyChart:  please call [phone].    For questions on nights, weekends or holidays:  call the Operator at [phone], and ask for the doctor on call for Neurosurgery.    For more information on spinal conditions:  please visit [email or web address]   Our office fax number is [phone]    Again, thank you for coming in today.        Jabari Swoveland, RN, BSN  Clinical Nurse Coordinator for Dr. Ifije Ohiorhenuan  Marc A. Asher, MD, [facility]  [facility]  Phone [phone]  Fax [phone]

## 2021-04-04 ENCOUNTER — Encounter: Admit: 2021-04-04 | Discharge: 2021-04-04 | Payer: MEDICARE

## 2021-04-04 ENCOUNTER — Ambulatory Visit: Admit: 2021-04-04 | Discharge: 2021-04-04 | Payer: MEDICARE

## 2021-04-04 DIAGNOSIS — I890 Lymphedema, not elsewhere classified: Secondary | ICD-10-CM

## 2021-04-04 DIAGNOSIS — Z981 Arthrodesis status: Secondary | ICD-10-CM

## 2021-04-04 DIAGNOSIS — R609 Edema, unspecified: Secondary | ICD-10-CM

## 2021-04-04 DIAGNOSIS — M79671 Pain in right foot: Secondary | ICD-10-CM

## 2021-04-04 NOTE — Progress Notes
Date of Service: 04/04/2021        Chief complaint    Chief Complaint   Patient presents with   ? Foot Pain       HPI  Chelsea Roach is a 70 y.o. female in today with primary complaint of foot pain on the right.  She describes significant lower extremity edema which she has had for years however the right lower extremity is much worse than the left.  She describes numbness, tingling and burning sensations in the right foot only.  He notes symptoms vary from the toes, to the arch and to the heel.  The pain in her foot limits her activity.  He currently is ambulating with 2 canes 1 of which is a quad cane.  He feels 1 year 7 months postop two-level TLIF April 2021 and is overall done very well from this.  She denies any radicular component whatsoever also denies any significant back pain.  Pain limits her walking and standing but again because of her foot pain.  He apparently has already seen a podiatrist.  She is concerned her right lower extremity swelling is coming from her back.  She does have a history of lymphedema and wears compression stockings on a regular basis.  She reports her right lower extremity edema seem to have gotten significantly worse in July of this year after traveling.                  Oswestry:         PMH    Medical History:   Diagnosis Date   ? Acquired hypothyroidism    ? History of dental problems     attached dental work, not sure what kind   ? Lymphedema    ? OSA (obstructive sleep apnea)    ? Spinal stenosis            ROS  Review of Systems   Cardiovascular: Positive for leg swelling.   All other systems reviewed and are negative.         FH    Family History   Problem Relation Age of Onset   ? Cancer-Breast Mother      Social History     Socioeconomic History   ? Marital status: Single   Tobacco Use   ? Smoking status: Never   ? Smokeless tobacco: Never   Vaping Use   ? Vaping Use: Never used   Substance and Sexual Activity   ? Alcohol use: Yes     Alcohol/week: 5.0 standard drinks     Types: 5 Glasses of wine per week   ? Drug use: Never   ? Sexual activity: Not Currently           [facility]    Surgical History:   Procedure Laterality Date   ? CESAREAN SECTION  1991   ? 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 REVISION TOTAL KNEE ARTHROPLASTY Right 06/2018   ? LUMBAR 3 LUMBAR 4 AND LUMBAR 4 LUMBAR 5 TRANSFORAMINAL LUMBAR INTERBODY FUSION, OPEN AND LUMBAR 3-5 FUSION N/A 08/24/2019    Performed by Jobe Marker, MD at [facility]   ? 22585--ARTHRODESIS ANTERIOR INTERBODY WITH MINIMAL DISCECTOMY - EACH ADDITIONAL INTERSPACE N/A 08/24/2019    Performed by Jobe Marker, [facility] at [facility]   ? 22840--POSTERIOR NON-SEGMENTAL INSTRUMENTATION SPINE N/A 08/24/2019    Performed by Jobe Marker, [facility]   ? 22853--INSERTION INTERBODY BIOMECHANICAL DEVICE WITH/ WITHOUT  INTEGRAL ANTERIOR INSTRUMENTATION FOR DEVICE ANCHORING TO INTERVERTEBRAL DISC SPACE IN CONJUNCTION WITH INTERBODY ARTHRODESIS - EACH INTERSPACE N/A 08/24/2019    Performed by Jobe Marker, [facility]   ? 22842--POSTERIOR SEGMENTAL INSTRUMENTATION - 3 TO 6 VERTEBRAL SEGMENTS N/A 08/24/2019    Performed by Jobe Marker, [facility]   ? ALLOGRAFT/ MORSELIZED/ PLACEMENT OSTEOPROMOTIVE MATERIAL - SPINE SURGERY ONLY N/A 08/24/2019    Performed by Jobe Marker, MD [facility]   ? O-ARM/ STEALTH NAVIGATION STEREOTACTIC COMPUTER-ASSISTED PROCEDURE - SPINAL N/A 08/24/2019    Performed by Jobe Marker, [facility]   ? ARTHRODESIS POSTERIOR/ POSTEROLATERAL WITH POSTERIOR INTERBODY WITH LAMINECTOMY/ DISCECTOMY - SINGLE INTERSPACE AND SEGMENT - LUMBAR N/A 08/24/2019    Performed by Jobe Marker, [facility]   ? ARTHRODESIS POSTERIOR/ POSTEROLATERAL WITH POSTERIOR INTERBODY WITH LAMINECTOMY/ DISCECTOMY - SINGLE INTERSPACE AND SEGMENT - EACH ADDITIONAL INTERSPACE AND SEGMENT N/A 08/24/2019    Performed by Jobe Marker, [facility]   ? LAMINECTOMY/ FACETECTOMY/ FORAMINOTOMY WITH DECOMPRESSION - 1 VERTEBRAL SEGMENT - CERVICAL N/A 08/24/2019    Performed by Jobe Marker, [facility]   ? LAMINECTOMY/ FACETECTOMY/ FORAMINOTOMY WITH DECOMPRESSION - 1 VERTEBRAL SEGMENT - EACH ADDITIONAL CERVICAL/ THORACIC/ LUMBAR SEGMENT N/A 08/24/2019    Performed by Jobe Marker, MD [facility]   ? AUTOGRAFT - SPINE SURGERY ONLY N/A 08/24/2019    Performed by Jobe Marker, [facility]   ? FUSION SPINE POSTERIOR - LUMBAR - EACH ADDITIONAL SEGMENT N/A 08/24/2019    Performed by Jobe Marker, MD at [facility]   ? CHOLECYSTECTOMY     ? HX KNEE ARTHROSCOPY Right     debridement & synovectomy       Meds    ? acetaminophen (TYLENOL) 325 mg tablet Take two tablets by mouth every 6 hours as needed for Pain.   ? cetirizine (ZYRTEC) 10 mg tablet Take 10 mg by mouth at bedtime daily.   ? cholecalciferol (VITAMIN D-3) 5000 unit tablet Take 1,000 Units by mouth daily.   ? fish oil /omega-3 fatty acids (SEA-OMEGA) 340/1000 mg capsule Take 1 capsule by mouth daily.   ? gabapentin (NEURONTIN) 100 mg capsule Take one capsule by mouth three times daily.   ? liothyronine (CYTOMEL) 5 mcg tab Take 5 mcg by mouth daily.   ? Magnesium 200 mg tab Take 1 tablet by mouth daily.   ? melatonin 5 mg tab Take 5 mg by mouth at bedtime daily.   ? SYNTHROID 125 mcg tablet Take 125 mcg by mouth daily.   ? Syringe with Needle (Disp) 3 mL 25 gauge x 1 syrg .MEDSUPPLY   ? thiamine (VITAMIN B-1) 100 mg/mL injection INJECT 100MG  INTRAMUSCULARLY EVERY 3 WEEKS   ? triamterene-hydrochlorothiazide 37.5-25 mg tablet Take 1 tablet by mouth daily.   ? Zinc Acetate (Oral) 50 mg (zinc) cap Take 1 capsule by mouth daily.       Allergies    Allergies   Allergen Reactions   ? Cephalexin SEE COMMENTS, ANAPHYLAXIS and EDEMA     Patient states tightening in throat  Other reaction(s): soa   ? Penicillins SEE COMMENTS, ANAPHYLAXIS and EDEMA     Patient states tightening in throat    Other reaction(s): Unknown Reaction   ? Chlorhexidine RASH   ? Wheat RASH, MENTAL STATUS CHANGES and SEE COMMENTS     Ear infections stopped  once pt got off of wheat   ? Peanut UNKNOWN and SEE COMMENTS     Skin breaks out   ? Pork Derived (Porcine) DIZZINESS     Light-headedness   ? Shellfish Containing Products NAUSEA AND VOMITING         Exam  Physical Exam   Constitutional: she is oriented to person, place, and time. she appears well-developed and well-nourished.    Head: Normocephalic and atraumatic.    Eyes: Conjunctivae and EOM are normal.    Pulmonary/Chest: Effort normal. No respiratory distress.   Neurological: she is alert and oriented to person, place, and time. No cranial nerve deficit or sensory deficit. she exhibits normal muscle tone. Coordination normal.   Skin: Skin is warm and dry.   Psychiatric: she has a normal mood and affect. her behavior is normal. Judgment and thought content normal.    Vitals reviewed.  The patient alert and oriented and in no acute distress.  Gait is steady/well-balanced.    Lumbosacral region skin dry and intact with no palpable irregularities or muscle spasm.  Lumbar incision well approximated/healed.  Hip rotation free and painless bilaterally.  Seated straight leg raise negative bilaterally at 90 degrees, no root tension signs.    No calf tenderness or clonus.  Motor strength:   Quadriceps-       Right   5/5,  Left  5/5.                             Anterior tibialis-  Right  5/5,  Left  5/5.                             EHL-                   Right  5/5,  Left  5/5  Deep tendon reflexes: 2+ out of 4 at the ankles and knees bilaterally so.  Bilateral lower extremity edema noted on the left 1+ to 2.  On the right 3+ to 4.  She has significant tenderness to palpation throughout the right lower extremity from the knee down especially on the foot.    Vitals:   Vitals:    04/04/21 1535   BP: (P) 119/72   BP Source: (P) Arm, Left Lower   Pulse: (P) 102   SpO2: (P) 95%   PainSc: Eight   Weight: (P) 122.5 kg (270 lb)   Height: (P) 172.7 cm (5' 8) Body mass index is 41.05 kg/m? (pended).      Imaging:   I independently reviewed the patient's imaging findings:  X-ray-Implants appear tight without evidence of fracturing or haloing.  X-rays appear stable when compared to her previous ones.  No acute findings.    Assessment/Plan    Impression:  Primary complaint right foot pain, lymphedema currently which is significantly worse on the right, 1 year 7 months status post two-level TLIF (L3-4 and L4-5, April 2021)    Reviewed films/pathology with the patient in detail.  Discussed pathology and reviewed films with the patient noting cannot explain her right lower extremity edema and foot pain from a spine standpoint.  She does not have a radicular type pain pattern and her x-rays appear stable/unchanged from her previous ones.  She has a history of known lymphedema and does exhibit significant lower extremity edema is from recommend further evaluation of this via her PCP  consider venous studies to rule out DVT and other pathologies-we suspect her foot pain is secondary to this.  The patient is agreeable to the above treatment plan.  Several questions were answered and they were encouraged to contact us if symptoms changed/worsen or if they have any further questions.  Follow up in approximately 1 year with new films                                   No orders of the defined types were placed in this encounter.                             Please note that documentation of records were done during a busy neurosurgical clinic. Attempts have been made to review the document for any errors. Please excuse for brevity and typographical errors.

## 2021-05-03 IMAGING — CR PELVIS
3 series · 3 of 3 positions shown · non-contrast
Comparison: none

[l-spine ap]
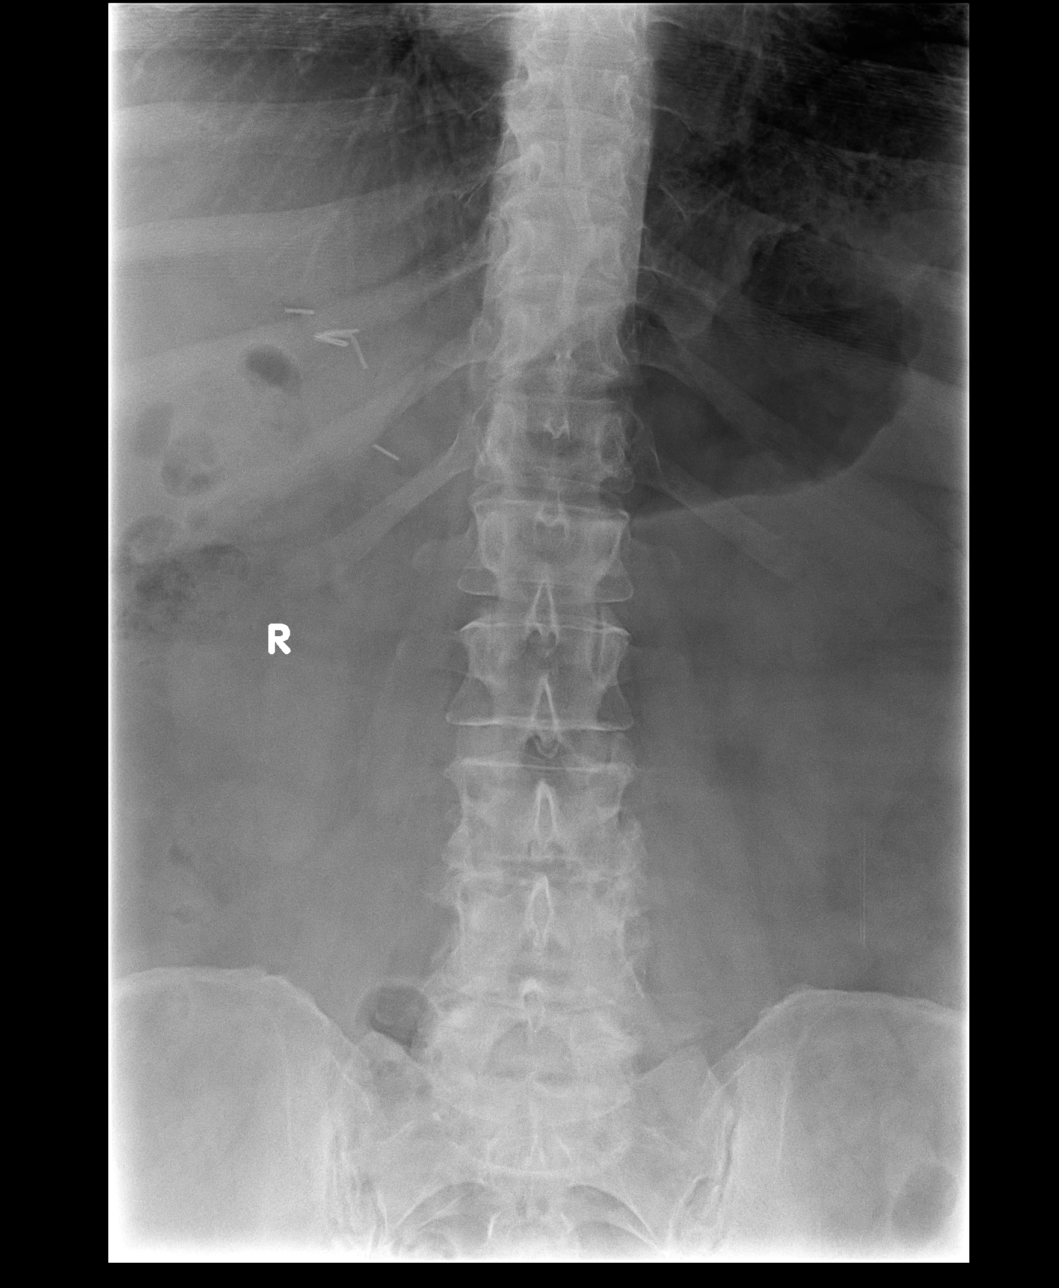

[l-spine lat]
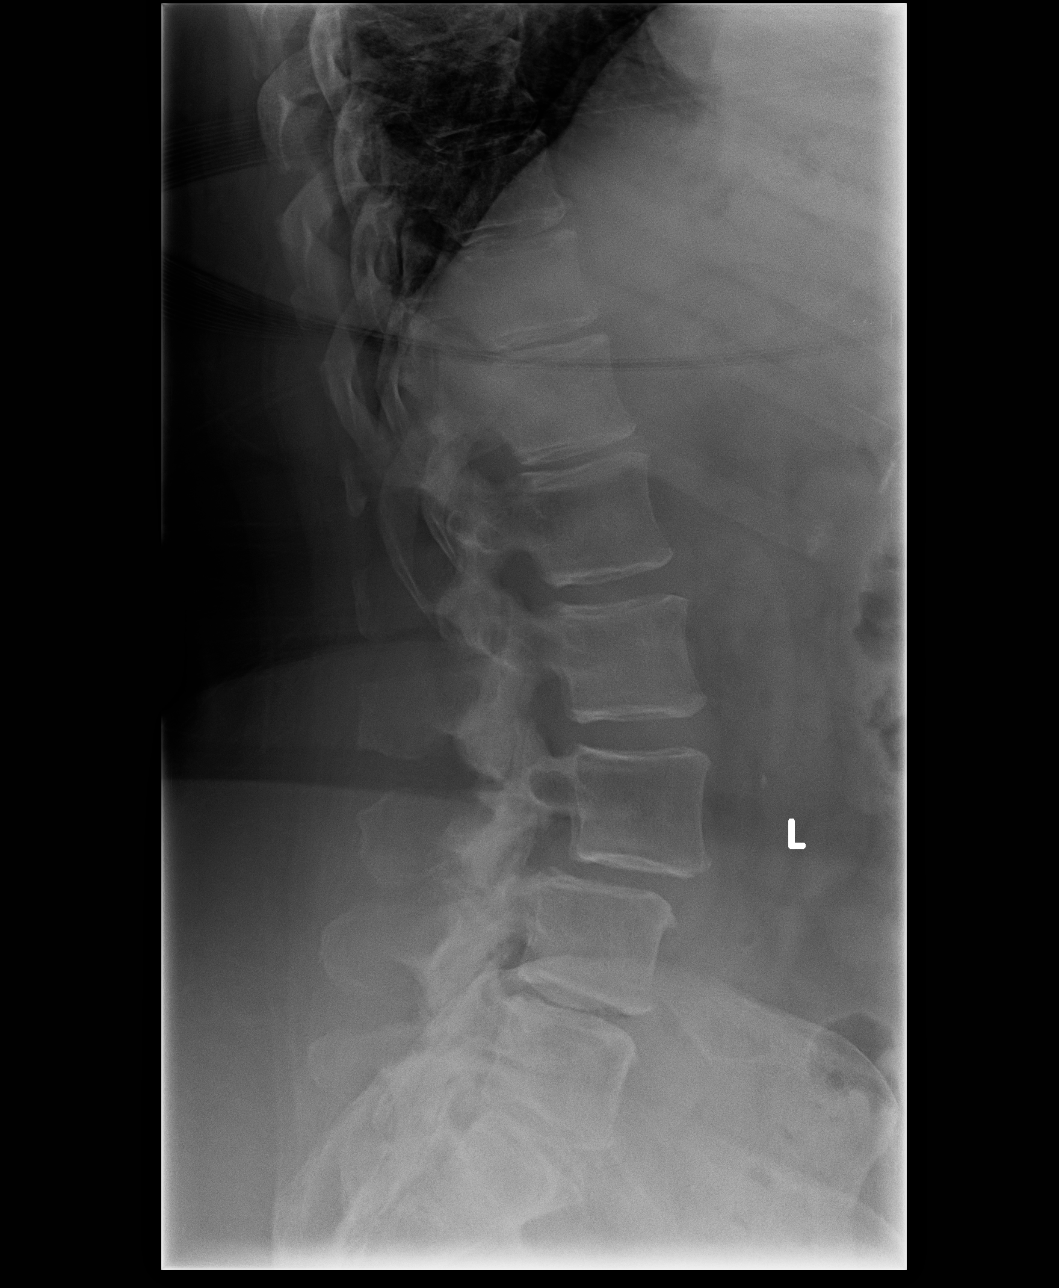

[l-spine l5-s1]
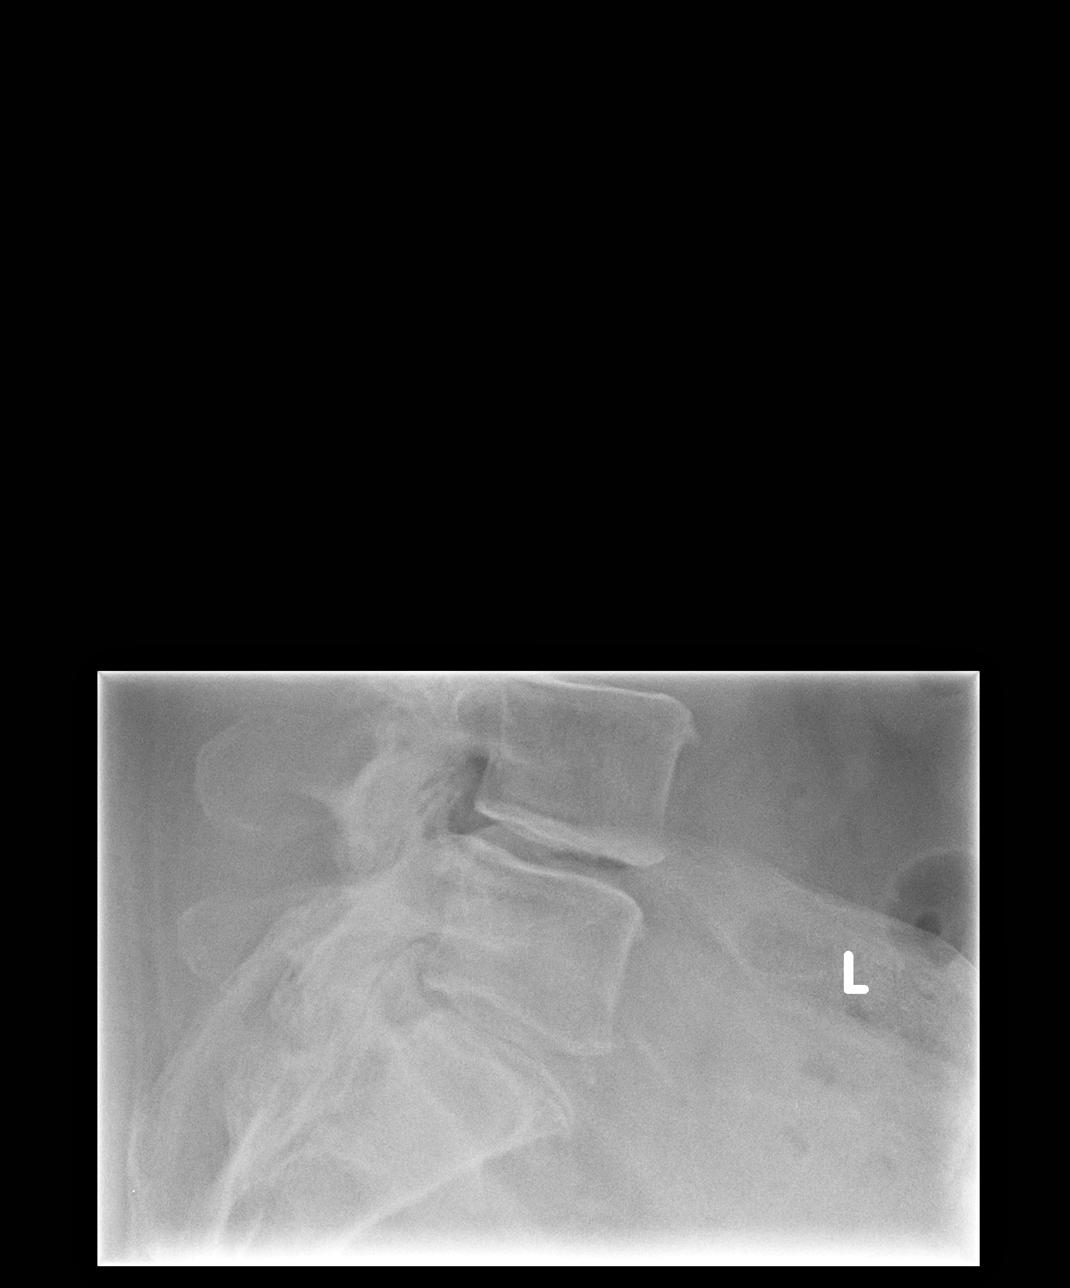

[3 of 3 positions shown; findings below may reference images not displayed]

EXAM

2 views  lumbar spine

INDICATION

back pain with rad
PT C/O CHRONIC LBP. HX OF SPONDYLOLITHESIS. TJ/BM

TECHNIQUE

AP and lateral views were obtained

COMPARISONS

None available.

FINDINGS

The lumbar vertebral body heights are grossly maintained with mild to moderate multilevel
degenerative disc disease with minimal marginal osteophyte formation. Surgical clip is seen in right
upper quadrant. There is no acute fracture, dislocation, or other acute osseus abnormality.  The
soft tissues are unremarkable.

IMPRESSION

1. Mild to moderate multilevel degenerative changes without acute osseus abnormality. If there is
persistent clinical concern follow up with MRI is recommended.

Tech Notes:

PT C/O CHRONIC LBP. HX OF SPONDYLOLITHESIS. TJ/BM

## 2022-01-25 ENCOUNTER — Encounter: Admit: 2022-01-25 | Discharge: 2022-01-25 | Payer: MEDICARE

## 2022-02-20 ENCOUNTER — Encounter: Admit: 2022-02-20 | Discharge: 2022-02-20 | Payer: MEDICARE

## 2022-02-20 NOTE — Telephone Encounter
Patient called as she was needing a copy of her imaging reports. I informed her she would need to reach out Medical Records and gave her a their phone number. Patient stated understanding.

## 2022-04-09 ENCOUNTER — Encounter: Admit: 2022-04-09 | Discharge: 2022-04-09 | Payer: MEDICARE

## 2022-04-09 DIAGNOSIS — Z981 Arthrodesis status: Secondary | ICD-10-CM

## 2022-04-09 NOTE — Patient Instructions
It was nice to see you today.  Thank you for choosing to visit our clinic.  Your time is important, and if you had to wait today, we do apologize.  Our goal is to run exactly on time.  However, on occasion, we get behind in clinic due to unexpected patient issues.  Thank you for your patience.    General Instructions:  Scheduling:  Our scheduling phone number is [phone].  Appointment Reminders on your cell phone:  Communication preferences can be managed in MyChart to ensure you receive important appointment notifications  How to reach our office:  Please send a MyChart message to the [facility] or leave a voicemail for the nurse at [phone].  How to get a medication refill:  Please use the MyChart Refill request or contact your pharmacy directly to request medication refills.  Please allow 72 business hours for request to be completed.    Support for many chronic illnesses is available through [facility] at [email or web address] or [phone].    For help with MyChart:  please call [phone].    For questions on nights, weekends or holidays:  call the Operator at [phone], and ask for the doctor on call for Neurosurgery.    For more information on spinal conditions:  please visit [email or web address]     Again, thank you for coming in today.

## 2022-04-10 ENCOUNTER — Ambulatory Visit: Admit: 2022-04-10 | Discharge: 2022-04-10 | Payer: MEDICARE

## 2022-04-10 ENCOUNTER — Encounter: Admit: 2022-04-10 | Discharge: 2022-04-10 | Payer: MEDICARE

## 2022-04-10 DIAGNOSIS — I8390 Asymptomatic varicose veins of unspecified lower extremity: Secondary | ICD-10-CM

## 2022-04-10 DIAGNOSIS — M79671 Pain in right foot: Secondary | ICD-10-CM

## 2022-04-10 DIAGNOSIS — Z981 Arthrodesis status: Secondary | ICD-10-CM

## 2022-04-10 DIAGNOSIS — M5410 Radiculopathy, site unspecified: Secondary | ICD-10-CM

## 2022-04-10 DIAGNOSIS — M4800 Spinal stenosis, site unspecified: Secondary | ICD-10-CM

## 2022-04-10 DIAGNOSIS — E039 Hypothyroidism, unspecified: Secondary | ICD-10-CM

## 2022-04-10 DIAGNOSIS — G4733 Obstructive sleep apnea (adult) (pediatric): Secondary | ICD-10-CM

## 2022-04-10 DIAGNOSIS — Z8719 Personal history of other diseases of the digestive system: Secondary | ICD-10-CM

## 2022-04-10 DIAGNOSIS — M2550 Pain in unspecified joint: Secondary | ICD-10-CM

## 2022-04-10 DIAGNOSIS — M5136 Other intervertebral disc degeneration, lumbar region: Secondary | ICD-10-CM

## 2022-04-10 DIAGNOSIS — I890 Lymphedema, not elsewhere classified: Secondary | ICD-10-CM

## 2022-04-10 DIAGNOSIS — R609 Edema, unspecified: Secondary | ICD-10-CM

## 2022-04-10 DIAGNOSIS — T148XXA Other injury of unspecified body region, initial encounter: Secondary | ICD-10-CM

## 2022-04-10 DIAGNOSIS — R200 Anesthesia of skin: Secondary | ICD-10-CM

## 2022-04-10 NOTE — Progress Notes
Date of Service: 04/10/2022        Chief complaint    Chief Complaint   Patient presents with    Left Hip - Pain    Right Foot - Numbness    Follow Up     1 yr fuv       HPI  Chelsea Roach is a 71 y.o. female in today with primary complaint of foot left ankle pain on the right.  She also describes chronic lower extremity edema which she has had for years.  She describes numbness, tingling and burning sensations in the right foot only.  She denies any radicular type component coming from her low back, hip or buttock region.  The pain in her foot limits her activity.  She currently uses a cane.  She is approximately 2 years 7 months postop two-level TLIF April 2021.  She denies any radicular component whatsoever also denies any significant back pain.  Pain limits her walking and standing but again because of her foot pain.  She tells me she has a has an appointment with neurology sometime within the next few months.  She does have a history of lymphedema and wears compression stockings on a regular basis.  She reports some occasional urinary what sounds like stress type incontinence.  He has been working with her chiropractor recently.                    Oswestry: Oswestry Total Score:: 44       PMH    Medical History:   Diagnosis Date    Acquired hypothyroidism     Degenerative disc disease, lumbar     History of dental problems     attached dental work, not sure what kind    Joint pain     Lymphedema     Nerve injury     OSA (obstructive sleep apnea)     Spinal stenosis     Varicose veins            ROS  Review of Systems   Cardiovascular:  Positive for leg swelling.   Musculoskeletal:         Complains of right foot/ankle pain in addition to numbness and tingling   All other systems reviewed and are negative.         FH    Family History   Problem Relation Age of Onset    Cancer-Breast Mother     Cancer Mother      Social History     Socioeconomic History    Marital status: Single   Tobacco Use    Smoking status: Never    Smokeless tobacco: Never   Vaping Use    Vaping Use: Never used   Substance and Sexual Activity    Alcohol use: Yes     Alcohol/week: 5.0 standard drinks of alcohol     Types: 5 Glasses of wine per week    Drug use: Never    Sexual activity: Not Currently     Birth control/protection: Post-menopausal           SH    Surgical History:   Procedure Laterality Date    CESAREAN SECTION  1991    HX KNEE REPLACMENT Right 06/2015    REVISION TOTAL KNEE ARTHROPLASTY Right 06/2018    LUMBAR 3 LUMBAR 4 AND LUMBAR 4 LUMBAR 5 TRANSFORAMINAL LUMBAR INTERBODY FUSION, OPEN AND LUMBAR 3-5 FUSION N/A 08/24/2019    Performed by Loraine Maple  E, MD [facility]    22585--ARTHRODESIS ANTERIOR INTERBODY WITH MINIMAL DISCECTOMY - EACH ADDITIONAL INTERSPACE N/A 08/24/2019    Performed by Jobe Marker, MD at [facility]    22840--POSTERIOR NON-SEGMENTAL INSTRUMENTATION SPINE N/A 08/24/2019    Performed by Jobe Marker, [facility]    22853--INSERTION INTERBODY BIOMECHANICAL DEVICE WITH/ WITHOUT INTEGRAL ANTERIOR INSTRUMENTATION FOR DEVICE ANCHORING TO INTERVERTEBRAL DISC SPACE IN CONJUNCTION WITH INTERBODY ARTHRODESIS - EACH INTERSPACE N/A 08/24/2019    Performed by Jobe Marker, [facility]    22842--POSTERIOR SEGMENTAL INSTRUMENTATION - 3 TO 6 VERTEBRAL SEGMENTS N/A 08/24/2019    Performed by Jobe Marker, MD [facility]    ALLOGRAFT/ MORSELIZED/ PLACEMENT OSTEOPROMOTIVE MATERIAL - SPINE SURGERY ONLY N/A 08/24/2019    Performed by Jobe Marker, MD [facility]    O-ARM/ STEALTH NAVIGATION STEREOTACTIC COMPUTER-ASSISTED PROCEDURE - SPINAL N/A 08/24/2019    Performed by Jobe Marker, MD [facility]    ARTHRODESIS POSTERIOR/ POSTEROLATERAL WITH POSTERIOR INTERBODY WITH LAMINECTOMY/ DISCECTOMY - SINGLE INTERSPACE AND SEGMENT - LUMBAR N/A 08/24/2019    Performed by Jobe Marker, MD [facility]    ARTHRODESIS POSTERIOR/ POSTEROLATERAL WITH POSTERIOR INTERBODY WITH LAMINECTOMY/ DISCECTOMY - SINGLE INTERSPACE AND SEGMENT - EACH ADDITIONAL INTERSPACE AND SEGMENT N/A 08/24/2019    Performed by Jobe Marker, MD [facility]    LAMINECTOMY/ FACETECTOMY/ FORAMINOTOMY WITH DECOMPRESSION - 1 VERTEBRAL SEGMENT - CERVICAL N/A 08/24/2019    Performed by Jobe Marker, MD [facility]    LAMINECTOMY/ FACETECTOMY/ FORAMINOTOMY WITH DECOMPRESSION - 1 VERTEBRAL SEGMENT - EACH ADDITIONAL CERVICAL/ THORACIC/ LUMBAR SEGMENT N/A 08/24/2019    Performed by Jobe Marker, MD [facility]    AUTOGRAFT - SPINE SURGERY ONLY N/A 08/24/2019    Performed by Jobe Marker, [facility]    FUSION SPINE POSTERIOR - LUMBAR - EACH ADDITIONAL SEGMENT N/A 08/24/2019    Performed by Jobe Marker, MD at [facility]    CHOLECYSTECTOMY      HX KNEE ARTHROSCOPY Right     debridement & synovectomy    KNEE SURGERY         Meds     acetaminophen (TYLENOL) 325 mg tablet Take two tablets by mouth every 6 hours as needed for Pain.    cetirizine (ZYRTEC) 10 mg tablet Take one tablet by mouth at bedtime daily.    cholecalciferol (VITAMIN D-3) 5000 unit tablet Take 1,000 Units by mouth daily.    fish oil /omega-3 fatty acids (SEA-OMEGA) 340/1000 mg capsule Take one capsule by mouth daily.    gabapentin (NEURONTIN) 100 mg capsule Take one capsule by mouth three times daily.    liothyronine (CYTOMEL) 5 mcg tab Take one tablet by mouth daily.    Magnesium 200 mg tab Take one tablet by mouth daily.    melatonin 5 mg tab Take one tablet by mouth at bedtime daily.    SYNTHROID 125 mcg tablet Take one tablet by mouth daily.    Syringe with Needle (Disp) 3 mL 25 gauge x 1 syrg .MEDSUPPLY    thiamine (VITAMIN B-1) 100 mg/mL injection INJECT 100MG  INTRAMUSCULARLY EVERY 3 WEEKS    triamterene-hydrochlorothiazide 37.5-25 mg tablet Take one tablet by mouth daily.    Zinc Acetate (Oral) 50 mg (zinc) cap Take one capsule by mouth daily.       Allergies    Allergies   Allergen Reactions  Cephalexin SEE COMMENTS, ANAPHYLAXIS and EDEMA Patient states tightening in throat  Other reaction(s): soa    Penicillins SEE COMMENTS, ANAPHYLAXIS and EDEMA     Patient states tightening in throat    Other reaction(s): Unknown Reaction    Chlorhexidine RASH    Wheat RASH, MENTAL STATUS CHANGES and SEE COMMENTS     Ear infections stopped once pt got off of wheat    Peanut UNKNOWN and SEE COMMENTS     Skin breaks out    Pork Derived (Porcine) DIZZINESS     Light-headedness    Shellfish Containing Products NAUSEA AND VOMITING         Exam  Physical Exam   Constitutional: she is oriented to person, place, and time. she appears well-developed and well-nourished.    Head: Normocephalic and atraumatic.    Eyes: Conjunctivae and EOM are normal.    Pulmonary/Chest: Effort normal. No respiratory distress.   Neurological: she is alert and oriented to person, place, and time. No cranial nerve deficit or sensory deficit. she exhibits normal muscle tone. Coordination normal.   Skin: Skin is warm and dry.   Psychiatric: she has a normal mood and affect. her behavior is normal. Judgment and thought content normal.    Vitals reviewed.  The patient alert and oriented and in no acute distress.  Gait is guarded-use a cane  Lumbosacral region skin dry and intact with no palpable irregularities or muscle spasm.  Lumbar incision well approximated/healed.  Hip rotation free and painless bilaterally.  Seated straight leg raise negative bilaterally at 90 degrees, no root tension signs.    No calf tenderness or clonus.  Motor strength:   Quadriceps-       Right   5/5,  Left  5/5.                             Anterior tibialis-  Right  5/5,  Left  5/5.                             EHL-                   Right  5/5,  Left  5/5  Deep tendon reflexes: 2+ out of 4 at the ankles and knees bilaterally so.  Bilateral lower extremity edema noted on the left 1+ to 2.  On the right 3+ to 4.  She has significant tenderness to palpation throughout the right lower extremity from the knee down especially on the foot.    Vitals:   Vitals:    04/10/22 1212   PainSc: Five   Weight: 122.5 kg (270 lb)     Body mass index is 41.05 kg/m? (pended).      Imaging:   I independently reviewed the patient's imaging findings:  X-ray-Implants appear tight without evidence of fracturing or haloing.  X-rays appear stable when compared to her previous ones.  No acute findings.    Assessment/Plan    Impression:  Primary complaint right foot/ankle pain, 2-year 7 months months status post two-level TLIF (L3-4 and L4-5, April 2021), numbness and tingling right lower extremity    Reviewed films/case with Dr. [place].  Reviewed films/pathology with the patient in detail.  Discussed pathology and reviewed films with the patient noting cannot explain her right lower extremity edema and foot pain from a spine standpoint.    At this point  recommend right lower extremity EMG.  We also recommend lumbar MRI for further evaluation of her leg symptoms.  Patient is currently neurologically intact and was instructed to contact us if this were to change.  The patient is agreeable to the above treatment plan.  Several questions were answered and they were encouraged to contact us if symptoms changed/worsen or if they have any further questions.  Follow up after both studies have been completed for further evaluation and recommendations.                                No orders of the defined types were placed in this encounter.                             Please note that documentation of records were done during a busy neurosurgical clinic. Attempts have been made to review the document for any errors. Please excuse for brevity and typographical errors.

## 2022-06-12 ENCOUNTER — Encounter: Admit: 2022-06-12 | Discharge: 2022-06-12 | Payer: MEDICARE

## 2022-06-12 NOTE — Telephone Encounter
Patient LVM requesting follow up after MRI and EMG. This RN scheduled patient for follow up visit with Dr. [place]. L Spine MRI is in [place].

## 2022-06-20 ENCOUNTER — Encounter: Admit: 2022-06-20 | Discharge: 2022-06-20 | Payer: MEDICARE

## 2022-06-20 NOTE — Telephone Encounter
This RN called patient and LVM stating her appointment with Dr. [place] for 2/27 will need to be rescheduled due to the patient rescheduling her EMG to April 3rd. This RN requested the patient return call to reschedule.

## 2022-06-22 ENCOUNTER — Encounter: Admit: 2022-06-22 | Discharge: 2022-06-22 | Payer: MEDICARE

## 2022-06-22 NOTE — Telephone Encounter
Patient LVM to reschedule appointment as Neurology had to reschedule her EMG. This RN rescheduled patient to see Dr. [place] on 4/9. EMG is scheduled for 4/3.

## 2022-06-22 NOTE — Telephone Encounter
This RN LVM for patient regarding upcoming appointment. The patient's EMG was rescheduled to August 01, 2022. This RN requested patient return call to reschedule.

## 2022-07-27 ENCOUNTER — Encounter: Admit: 2022-07-27 | Discharge: 2022-07-27 | Payer: MEDICARE

## 2022-07-27 NOTE — Patient Instructions
--   Preferred method of communication is through MyChart message, if the issue cannot wait until your next scheduled follow up.   -- MyChart may be used for non-emergent communication. MyChart messages are not reviewed after hours or over the weekend/holidays/after 4PM. Staff will reply to your email within 24-48 business hours.       -- If you do not hear from us within one week of a lab or imaging study being completed, please call/send MyChart message to the office to be sure that we have received the results. This is especially challenging when tests are done outside of the [facility] system, as many times results do not make it back to our office for a variety of reasons. In our office no news is good news does not apply. You should hear from us with results for each test.  [facility] lab/imaging results:  Due to the CARES act, results automatically release to MyChart.  Dr. Varon will continue to send you a result note on any labs that he orders.  With these changes, you may see your results before Dr. Varon does. Please allow up to 72 hours for review and response to your results.     -- If you are having acute (new/sudden onset) or severe/worsening neurologic symptoms, please call 911 or seek care in ED.    -- For scheduling of IMAGING/RADIOLOGY, please call [phone] at your convenience to schedule your studies.  -- For referrals placed during the visit, if you have not heard from scheduling within one week, please call the call center at [phone] to get scheduling assistance.  -- For refills on medications, please first contact your pharmacy, who will fax a refill authorization request form to our office.  Weekdays only. Allow up to 2 business days for refills. Please plan ahead, as refills will not be filled after hours.  -- Our scheduling staff, Lindsey, may be contacted [phone] for scheduling needs.   -- Glee Lashomb, RN, may be contacted at [phone] for urgent needs. Staff will return your call within 24 business hours.     For Appointments:   -- Please try to arrive early for your appointment time to help facilitate your visit. 15 minutes early is recommended.   -- If you are late to your appointment, we reserve the right to ask you to reschedule or wait until next available time to be seen in fairness to other patients scheduled that day.   -- There are times when we are running behind in clinic. Our goal is to always be on time, however, there are time when unexpected events occur with patients, which may cause a delay. We appreciate your understanding when this occurs.

## 2022-08-01 ENCOUNTER — Encounter: Admit: 2022-08-01 | Discharge: 2022-08-01 | Payer: MEDICARE

## 2022-08-01 ENCOUNTER — Ambulatory Visit: Admit: 2022-08-01 | Discharge: 2022-08-02 | Payer: MEDICARE

## 2022-08-01 DIAGNOSIS — T148XXA Other injury of unspecified body region, initial encounter: Secondary | ICD-10-CM

## 2022-08-01 DIAGNOSIS — M5136 Other intervertebral disc degeneration, lumbar region: Secondary | ICD-10-CM

## 2022-08-01 DIAGNOSIS — M5410 Radiculopathy, site unspecified: Secondary | ICD-10-CM

## 2022-08-01 DIAGNOSIS — Z981 Arthrodesis status: Secondary | ICD-10-CM

## 2022-08-01 DIAGNOSIS — Z8719 Personal history of other diseases of the digestive system: Secondary | ICD-10-CM

## 2022-08-01 DIAGNOSIS — R200 Anesthesia of skin: Secondary | ICD-10-CM

## 2022-08-01 DIAGNOSIS — I8390 Asymptomatic varicose veins of unspecified lower extremity: Secondary | ICD-10-CM

## 2022-08-01 DIAGNOSIS — M2550 Pain in unspecified joint: Secondary | ICD-10-CM

## 2022-08-01 DIAGNOSIS — G4733 Obstructive sleep apnea (adult) (pediatric): Secondary | ICD-10-CM

## 2022-08-01 DIAGNOSIS — I890 Lymphedema, not elsewhere classified: Secondary | ICD-10-CM

## 2022-08-01 DIAGNOSIS — M4800 Spinal stenosis, site unspecified: Secondary | ICD-10-CM

## 2022-08-01 DIAGNOSIS — E039 Hypothyroidism, unspecified: Secondary | ICD-10-CM

## 2022-08-01 NOTE — Progress Notes
EMG/NCS Procedure Time Out Check List:  Prior to the start of the procedure, I personally confirmed the following:      Patient Identity (name & date of birth): Yes  Procedure: Yes  Site: Yes  Body Part: Yes    Time out was performed. On the verbal pain analog scale of 0 to 10, patient's pain level was 0 before the procedure and 0 afterwards. Test was completed without complication.     The risks of the procedure, including pain, were discussed with the patient.

## 2022-08-02 DIAGNOSIS — R609 Edema, unspecified: Secondary | ICD-10-CM

## 2022-08-02 DIAGNOSIS — R202 Paresthesia of skin: Secondary | ICD-10-CM

## 2022-08-02 DIAGNOSIS — M79671 Pain in right foot: Secondary | ICD-10-CM

## 2022-08-07 ENCOUNTER — Encounter: Admit: 2022-08-07 | Discharge: 2022-08-07 | Payer: MEDICARE

## 2022-08-07 ENCOUNTER — Ambulatory Visit: Admit: 2022-08-07 | Discharge: 2022-08-07 | Payer: MEDICARE

## 2022-08-07 DIAGNOSIS — I8390 Asymptomatic varicose veins of unspecified lower extremity: Secondary | ICD-10-CM

## 2022-08-07 DIAGNOSIS — M4800 Spinal stenosis, site unspecified: Secondary | ICD-10-CM

## 2022-08-07 DIAGNOSIS — Z8719 Personal history of other diseases of the digestive system: Secondary | ICD-10-CM

## 2022-08-07 DIAGNOSIS — M79671 Pain in right foot: Secondary | ICD-10-CM

## 2022-08-07 DIAGNOSIS — M48061 Spinal stenosis, lumbar region without neurogenic claudication: Secondary | ICD-10-CM

## 2022-08-07 DIAGNOSIS — M5410 Radiculopathy, site unspecified: Secondary | ICD-10-CM

## 2022-08-07 DIAGNOSIS — I890 Lymphedema, not elsewhere classified: Secondary | ICD-10-CM

## 2022-08-07 DIAGNOSIS — M5136 Other intervertebral disc degeneration, lumbar region: Secondary | ICD-10-CM

## 2022-08-07 DIAGNOSIS — M2550 Pain in unspecified joint: Secondary | ICD-10-CM

## 2022-08-07 DIAGNOSIS — T148XXA Other injury of unspecified body region, initial encounter: Secondary | ICD-10-CM

## 2022-08-07 DIAGNOSIS — G4733 Obstructive sleep apnea (adult) (pediatric): Secondary | ICD-10-CM

## 2022-08-07 DIAGNOSIS — Z981 Arthrodesis status: Secondary | ICD-10-CM

## 2022-08-07 DIAGNOSIS — E039 Hypothyroidism, unspecified: Secondary | ICD-10-CM

## 2022-08-07 NOTE — Progress Notes
Date of Service: 08/07/2022         Chief complaint    Chief Complaint   Patient presents with    Lower Back - Follow Up    Follow Up     FUV on imaging       HPI  Chelsea Roach is a 72 y.o. female who presents with a history of prior two-level TLIF approximately 3 years ago here with significant right leg pain as well as urinary incontinence.  She has a longstanding history of urinary incontinence.  She has had multiple floor surgeries to address this.  However, these have not helped.  She reports that her incontinence symptoms are progressively getting worse.  In addition, she complains of some significant right leg and foot numbness and tingling.  Approximately 2 to 3 months ago she was having some sharp shooting pain starting her low back radiating to her right leg and foot.  Since then, this is subsided.  However, the right foot pain as well as numbness persists  Discussed with her possible epidural steroid injections on the right at L5-S1.  She is not interested in this.  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      FH    Family History   Problem Relation Age of Onset    Cancer-Breast Mother     Cancer Mother      Social History     Socioeconomic History    Marital status: Single   Tobacco Use    Smoking status: Never    Smokeless tobacco: Never   Vaping Use    Vaping status: Never Used   Substance and Sexual Activity    Alcohol use: Yes     Alcohol/week: 5.0 standard drinks of alcohol     Types: 5 Glasses of wine per week    Drug use: Never    Sexual activity: Not Currently     Partners: Male     Birth control/protection: Post-menopausal           SH    Surgical History:   Procedure Laterality Date    CESAREAN SECTION  1991    HX KNEE REPLACMENT Right 06/2015    REVISION TOTAL KNEE ARTHROPLASTY Right 06/2018    LUMBAR 3 LUMBAR 4 AND LUMBAR 4 LUMBAR 5 TRANSFORAMINAL LUMBAR INTERBODY FUSION, OPEN AND LUMBAR 3-5 FUSION N/A 08/24/2019    Performed by Jobe Marker, [place] at [facility]    22585--ARTHRODESIS ANTERIOR INTERBODY WITH MINIMAL DISCECTOMY - EACH ADDITIONAL INTERSPACE N/A 08/24/2019    Performed by Jobe Marker, MD at [facility]    22840--POSTERIOR NON-SEGMENTAL INSTRUMENTATION SPINE N/A 08/24/2019    Performed by  Sueo Cullen E, MD at [facility]    22853--INSERTION INTERBODY BIOMECHANICAL DEVICE WITH/ WITHOUT INTEGRAL ANTERIOR INSTRUMENTATION FOR DEVICE ANCHORING TO INTERVERTEBRAL DISC SPACE IN CONJUNCTION WITH INTERBODY ARTHRODESIS - EACH INTERSPACE N/A 08/24/2019    Performed by Jobe Marker, MD at [facility]    22842--POSTERIOR SEGMENTAL INSTRUMENTATION - 3 TO 6 VERTEBRAL SEGMENTS N/A 08/24/2019    Performed by Jobe Marker, MD [facility]    ALLOGRAFT/ MORSELIZED/ PLACEMENT OSTEOPROMOTIVE MATERIAL - SPINE SURGERY ONLY N/A 08/24/2019    Performed by Jobe Marker, MD [facility]    O-ARM/ STEALTH NAVIGATION STEREOTACTIC COMPUTER-ASSISTED PROCEDURE - SPINAL N/A 08/24/2019    Performed by Jobe Marker, MD [facility]    ARTHRODESIS POSTERIOR/ POSTEROLATERAL WITH POSTERIOR INTERBODY WITH LAMINECTOMY/ DISCECTOMY - SINGLE INTERSPACE AND SEGMENT - LUMBAR N/A 08/24/2019    Performed by Jobe Marker, MD [facility]    ARTHRODESIS POSTERIOR/ POSTEROLATERAL WITH POSTERIOR INTERBODY WITH LAMINECTOMY/ DISCECTOMY - SINGLE INTERSPACE AND SEGMENT - EACH ADDITIONAL INTERSPACE AND SEGMENT N/A 08/24/2019    Performed by Jobe Marker, MD [facility]    LAMINECTOMY/ FACETECTOMY/ FORAMINOTOMY WITH DECOMPRESSION - 1 VERTEBRAL SEGMENT - CERVICAL N/A 08/24/2019    Performed by Jobe Marker, MD [facility]    LAMINECTOMY/ FACETECTOMY/ FORAMINOTOMY WITH DECOMPRESSION - 1 VERTEBRAL SEGMENT - EACH ADDITIONAL CERVICAL/ THORACIC/ LUMBAR SEGMENT N/A 08/24/2019    Performed by Jobe Marker, MD [facility]    AUTOGRAFT - SPINE SURGERY ONLY N/A 08/24/2019    Performed by Jobe Marker, [facility]    FUSION SPINE POSTERIOR - LUMBAR - EACH ADDITIONAL SEGMENT N/A 08/24/2019    Performed by Jobe Marker, MD at [facility]    CHOLECYSTECTOMY      HX KNEE ARTHROSCOPY Right     debridement & synovectomy    KNEE SURGERY         Meds     acetaminophen (TYLENOL) 325 mg tablet Take two tablets by mouth every 6 hours as needed for Pain.    cetirizine (ZYRTEC) 10 mg tablet Take one tablet by mouth at bedtime daily.    cholecalciferol (VITAMIN D-3) 5000 unit tablet Take 1,000 Units by mouth daily.    fish oil /omega-3 fatty acids (SEA-OMEGA) 340/1000 mg capsule Take one capsule by mouth daily.    gabapentin (NEURONTIN) 100 mg capsule Take one capsule by mouth three times daily.    liothyronine (CYTOMEL) 5 mcg tab Take one tablet by mouth daily.    Magnesium 200 mg tab Take one tablet by mouth daily.    melatonin 5 mg tab Take one tablet by mouth at bedtime daily.    SYNTHROID 125 mcg tablet Take one tablet by mouth daily.    Syringe with Needle (Disp) 3 mL 25 gauge x 1 syrg .MEDSUPPLY    thiamine (VITAMIN B-1) 100 mg/mL injection INJECT 100MG  INTRAMUSCULARLY EVERY 3 WEEKS    triamterene-hydrochlorothiazide 37.5-25 mg tablet Take one tablet by mouth daily.    Zinc Acetate (Oral) 50 mg (zinc) cap Take one capsule by mouth daily.       Allergies    Allergies   Allergen Reactions    Cephalexin SEE COMMENTS, ANAPHYLAXIS and EDEMA     Patient states tightening in throat  Other reaction(s): soa    Penicillins SEE COMMENTS, ANAPHYLAXIS and EDEMA     Patient states tightening in throat    Other reaction(s): Unknown Reaction  Chlorhexidine RASH Wheat RASH, MENTAL STATUS CHANGES and SEE COMMENTS     Ear infections stopped once pt got off of wheat    Peanut UNKNOWN and SEE COMMENTS     Skin breaks out    Pork Derived (Porcine) DIZZINESS     Light-headedness    Shellfish Containing Products NAUSEA AND VOMITING         Exam  Physical Exam   Constitutional: she is oriented to person, place, and time. she appears well-developed and well-nourished.    Head: Normocephalic and atraumatic.    Eyes: Conjunctivae and EOM are normal.    Pulmonary/Chest: Effort normal. No respiratory distress.   Neurological: she is alert and oriented to person, place, and time. No cranial nerve deficit or sensory deficit. she exhibits normal muscle tone. Coordination normal.   Skin: Skin is warm and dry.   Psychiatric: she has a normal mood and affect. her behavior is normal. Judgment and thought content normal.    Vitals reviewed.  A&Ox3  FS, TML, EOMI      D B Tr Hg IO  R 5/5 5/5 5/5 5/5 5/5  L 5/5 5/5 5/5 5/5 5/5        HF KE DF PF EHL  R  5/5 5/5 4/5 4/5 4/5  L  5/5 5/5 5/5 5/5 5/5  Antalgic gait    Vitals:   Vitals:    08/07/22 1306   BP: (!) 138/90   Pulse: 103   SpO2: 98%   PainSc: Zero   Weight: 122.5 kg (270 lb)   Height: 170.2 cm (5' 7)     Body mass index is 42.29 kg/m?.      Imaging:   I independently reviewed the patient's imaging findings:  MRI L-spine reviewed notable for moderate to severe stenosis at L2-3 above a prior L3 fusion.  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  However, with respect to her urinary incontinence it is unclear to me if this has a spine origin or not.  She has had multiple pelvic floor surgery without improvement in her symptoms.  She does have some moderate to severe stenosis at L2-3 but does not have any significant back pain or signs symptoms concerning for neurogenic claudication.  However, she is quite debilitated and distressed by this incontinence.  As such I think it is reasonable to offer her a laminectomy at L2-3 with the hope that this addresses her incontinence.  With respect to her right-sided foot pain, recommend a right-sided L5-S1 microdiscectomy  We will plan for surgery at a mutually convenient time  Patient has had an adequate trial of > 12 month of rest, exercise, multimodal treatment, and the passage of time without improvement of symptoms. The pain has significant impact on the daily quality of life.     The risks and benefits of surgery were explained in detail to the patient which included, but certainly were not limited to: bleeding, infection, nerve or vessel damage, scar, pain, risks of anesthesia, death, need for  further surgery, iatrogenic instability, heart attack, stroke, massive bleeding, coma death. The patient understands the risks of the procedure and elects to proceed.                             No orders of the defined types were placed in this encounter.                             Please note that documentation of records were done during a busy neurosurgical clinic. Attempts have been made to review the document for any errors. Please excuse for brevity and typographical errors.

## 2022-08-07 NOTE — Telephone Encounter
Patient LVM requesting to change surgery date form 10/05/22 to 10/04/22. This RN returned call and spoke with patient. Surgery date will be changed to 10/04/2022.

## 2022-09-04 ENCOUNTER — Encounter: Admit: 2022-09-04 | Discharge: 2022-09-04 | Payer: MEDICARE

## 2022-09-04 ENCOUNTER — Ambulatory Visit: Admit: 2022-09-04 | Discharge: 2022-09-04 | Payer: MEDICARE

## 2022-09-04 DIAGNOSIS — M4800 Spinal stenosis, site unspecified: Secondary | ICD-10-CM

## 2022-09-04 DIAGNOSIS — T148XXA Other injury of unspecified body region, initial encounter: Secondary | ICD-10-CM

## 2022-09-04 DIAGNOSIS — Z8719 Personal history of other diseases of the digestive system: Secondary | ICD-10-CM

## 2022-09-04 DIAGNOSIS — I8390 Asymptomatic varicose veins of unspecified lower extremity: Secondary | ICD-10-CM

## 2022-09-04 DIAGNOSIS — Z01818 Encounter for other preprocedural examination: Secondary | ICD-10-CM

## 2022-09-04 DIAGNOSIS — G4733 Obstructive sleep apnea (adult) (pediatric): Secondary | ICD-10-CM

## 2022-09-04 DIAGNOSIS — I890 Lymphedema, not elsewhere classified: Secondary | ICD-10-CM

## 2022-09-04 DIAGNOSIS — E039 Hypothyroidism, unspecified: Secondary | ICD-10-CM

## 2022-09-04 DIAGNOSIS — M2550 Pain in unspecified joint: Secondary | ICD-10-CM

## 2022-09-04 DIAGNOSIS — M5136 Other intervertebral disc degeneration, lumbar region: Secondary | ICD-10-CM

## 2022-09-04 LAB — CBC AND DIFF
ABSOLUTE BASO COUNT: 0 K/UL (ref 0–0.20)
ABSOLUTE EOS COUNT: 0 K/UL (ref 0–0.45)
ABSOLUTE LYMPH COUNT: 2 K/UL (ref 1.0–4.8)
ABSOLUTE MONO COUNT: 0.7 K/UL (ref 0–0.80)
ABSOLUTE NEUTROPHIL: 4.7 K/UL (ref 1.8–7.0)
BASOPHILS %: 1 % (ref 0–2)
EOSINOPHILS %: 1 % (ref 0–5)
LYMPHOCYTES %: 27 % (ref 24–44)
MCH: 32 pg (ref 26–34)
MCHC: 33 g/dL — ABNORMAL HIGH (ref 32.0–36.0)
MONOCYTES %: 9 % (ref 4–12)
WBC COUNT: 7.6 K/UL (ref 4.5–11.0)

## 2022-09-04 LAB — BASIC METABOLIC PANEL
BLD UREA NITROGEN: 17 mg/dL (ref 7–25)
CALCIUM: 9.6 mg/dL (ref 8.5–10.6)
CHLORIDE: 104 MMOL/L (ref 98–110)
CO2: 27 MMOL/L (ref 21–30)
CREATININE: 0.7 mg/dL (ref 0.4–1.00)
EGFR: >60 mL/min (ref >60)
POTASSIUM: 4.2 MMOL/L (ref 3.5–5.1)
SODIUM: 138 MMOL/L (ref 137–147)

## 2022-09-04 NOTE — Pre-Anesthesia Patient Instructions
PREPROCEDURE INFORMATION    Arrival at the hospital  [facility]  [address]  [place], [place] [address]    Park in the P5 parking garage located at [address], [place], [place] [address].   If parking in the P5 garage, take the east elevators in the parking garage to the second level and walk to the entrance of the [facility].    Enter through the 1st floor main entrance and check in with Information Desk.   If you are a woman between the ages of 10 and 55, and have not had a hysterectomy, you will be asked for a urine sample prior to surgery.  Please do not urinate before arriving in the Surgery Waiting Room.  Once there, check in and let the attendant know if you need to provide a sample.    You will receive a call with your surgery arrival time between 2:30pm and 4:30pm the last business day before your procedure.  If you do not receive a call, please call [phone] before 4:30pm or [phone] after 4:30pm.    Eating or drinking before surgery  Nothing to eat after 11:00pm the night before your surgery including gum, mints and candy.  You may have clear liquids up to 2 hours before your surgery time.  Clear Liquid Examples include:  Water, Clear juice (apple or cranberry (no pulp or orange juice) - If diabetic blood sugar must be <200, Coffee and tea with or without sugar (no cream), Sports drink - Powerade/Gatorade, Soda, and Bowel Prep solutions only if ordered by surgeon.  If you have received specific instructions from your surgeon, please follow those.    Planning transportation for outpatient procedure  For your safety, you will need to arrange for a responsible ride/person to accompany you home due to sedation or anesthesia with your procedure.  An Uber, taxi or other public transportation driver is not considered a responsible person to accompany you home.    Bath/Shower Instructions  Take a bath or shower using the special soap given to you in PAC. Use half the bottle the night before, and the other half the morning of your procedure. Use clean towels with each bath or shower.  Put on clean clothes after bath or shower.  Avoid using lotion and oils.  If you are having surgery above the waist, wear a shirt that fastens up the front.  Sleep on clean sheets if bath or shower is done the night before procedure.    Morning of your procedure:  Brush your teeth and tongue  Do not smoke, vape, chew or user any tobacco products.  Do not shave the area where you will have surgery.  Remove nail polish, makeup and all jewelry (including piercings) before coming to the hospital.  Dress in clean, loose, comfortable clothing.    Valuables  Leave money, credit cards, jewelry, and any other valuables at home. If medications are being filled and picked up at a [facility] pharmacy, payment will be needed. [facility] is not responsible for the loss or breakage of personal items.    What to bring to the hospital  ID/Insurance card  Medical Device card  Official documents for legal guardianship  Copy of your Living Will, Advanced Directives, and/or Durable Power of Attorney. If you have these documents, please bring them to the admissions office on the day of your surgery to be scanned into your records.  Do not bring medications from home   unless instructed by a pharmacist.  CPAP/BiPAP machine (including all supplies)  Walker, cane, or motorized scooter  Cases for glasses/hearing aids/contact lens (bring solutions for contacts)  Stimulator remote  Insulin pump and supplies as directed by pharmacy     Notify us at [facility]: [phone] on the day of your procedure if:  You need to cancel your procedure.  You are going to be late.    Notify your surgeon if:  There is a possibility that you are pregnant.  You become ill with a cough, fever, sore throat, nausea, vomiting or flu-like symptoms.  You have any open wounds/sores that are red, painful, draining, or are new since you last saw the doctor.  You need to cancel your procedure.    Preparing to get your medications at discharge  Your surgeon may prescribe you medications to take after your procedure.  If you like the convenience of having your medications filled here at [facility], please do the following:  Go to [facility] pharmacy after your PAC appointment to put a credit card on file.  Call [facility] pharmacy at [phone] (Monday-Friday 7am-9pm or Saturday and Sunday 9am-5pm) to put a credit card on file.  Bring a credit card or cash on the day of your procedure- please leave with a family member rather than bringing it into the preop area.    Current Visitor Policy:  Visitors must be free of fever and symptoms to be in our facilities.  No more than 2 visitors per patient are allowed.  Additional guidelines may vary, based on patient care area or patient's condition.  Patients in semiprivate rooms may have visitors, but visits should be coordinated so only two total visitors are in a room at a time due to space limitations.  Children younger than age 12 are allowed to visit inpatients.    Thank you for participating in your Preoperative Assessment visit today.    If you have any changes to your health or hospitalizations between now and your surgery, please call us at [phone].    Instructions given to patient via: MyChart and verbal

## 2022-09-04 NOTE — Unmapped
[facility] Anesthesia Pre-Procedure Evaluation    Name: Chelsea Roach      MRN: [id number]     DOB: [birth year]     Age: 72 y.o.     Sex: female   _________________________________________________________________________     Procedure Info:   Procedure Information       Date/Time: 10/04/22 0730    Procedures:       63047-LAMINECTOMY/ FACETECTOMY/ FORAMINOTOMY WITH DECOMPRESSION - 1 VERTEBRAL SEGMENT - LUMBAR Lumbar 2-Lumbar 3 Open Laminectomy, Lumbar 5/Sacral 1 Right Microdiscectomy - Lumbar 2-Lumbar 3 Open Laminectomy, Lumbar 5/Sacral 1 Right Microdiscectomy  C-arm, Microscope      LAMINOTOMY WITH DECOMPRESSION OF NERVE ROOT/ EXCISION HERNIATED DISC - 1 INTERSPACE - LUMBAR (Right)    Location: [facility] / [facility]/Periop    Surgeons: Jobe Marker, MD            Physical Assessment  Vital Signs (last filed in past 24 hours):  BP: 111/85 (05/07 1300)  Temp: 36.2 ?C (97.2 ?F) (05/07 1300)  Pulse: 102 (05/07 1300)  Respirations: 13 PER MINUTE (05/07 1300)  SpO2: 96 % (05/07 1300)  O2 Device: None (Room air) (05/07 1300)  Height: 170.2 cm (5' 7) (05/07 1300)  Weight: 122.2 kg (269 lb 6.4 oz) (05/07 1300)  Admission / Dosing Weight: 122.2 kg (269 lb 6.4 oz) (05/07 1300)      Patient History   Allergies   Allergen Reactions    Cephalexin SEE COMMENTS, ANAPHYLAXIS and EDEMA     Patient states tightening in throat  Other reaction(s): soa    Penicillins SEE COMMENTS, ANAPHYLAXIS and EDEMA     Patient states tightening in throat    Other reaction(s): Unknown Reaction    Chlorhexidine RASH    Wheat RASH, MENTAL STATUS CHANGES and SEE COMMENTS     Ear infections stopped once pt got off of wheat    Peanut UNKNOWN and SEE COMMENTS     Skin breaks out    Pork Derived (Porcine) DIZZINESS     Light-headedness when eating pork    Shellfish Containing Products NAUSEA AND VOMITING        Current Medications    Medication Directions   cetirizine (ZYRTEC) 10 mg tablet Take one tablet by mouth at bedtime daily.   cholecalciferol (VITAMIN D-3) 5000 unit tablet Take 1,000 Units by mouth daily.   fish oil /omega-3 fatty acids (SEA-OMEGA) 340/1000 mg capsule Take one capsule by mouth daily.   ibuprofen (ADVIL) 200 mg tablet Take one tablet by mouth every 6 hours as needed for Pain. Take with food.   liothyronine (CYTOMEL) 5 mcg tab Take two tablets by mouth daily.   Magnesium 200 mg tab Take one tablet by mouth at bedtime daily.   melatonin 5 mg tab Take one tablet by mouth at bedtime daily.   multivitamin (ONE-A-DAY) tablet Take one tablet by mouth daily.   SYNTHROID 125 mcg tablet Take one tablet by mouth daily.   Syringe with Needle (Disp) 3 mL 25 gauge x 1 syrg .MEDSUPPLY   thiamine (VITAMIN B-1) 100 mg/mL injection INJECT 100MG  INTRAMUSCULARLY EVERY 3 WEEKS   Zinc Acetate (Oral) 50 mg (zinc) cap Take one capsule by mouth daily.       Review of Systems/Medical History        PONV Screening: Non-smoker and Female sex    No history of anesthetic complications    No family history of anesthetic complications      Airway - negative  08/24/19; Ventilated by mask with oral airway (2); Direct laryngoscopy, ETT Size: 7mm; Mac; Blade Size: 3; Cricoid Pressure: No; 1-Full view of the glottis      Pulmonary       Not a current smoker (never smoker)          Obstructive Sleep Apnea          Interventions: CPAP; noncompliant      Cardiovascular         Exercise tolerance: <4 METS (severely limited by back and right leg pain, requires cane, able to walk 20 minutes around house. Denies CP, DOE, syncope)      No hypertension (tends to have hypotension, typically 100/70s)          Valvular problems/murmurs (childhood murmur; benign/resolved):            No hx of coronary artery disease        No palpitations        PVD (chronic LE edema; dx as lymphedema 2017 - wears compression stockings and elevating legs)      No indications/hx of CHF        No orthopnea (sleeps on side due to back/leg pain)      GI/Hepatic/Renal - negative                Neuro/Psych No seizures        No CVA      Neuropathy (right foot)      Weakness (right leg)      Musculoskeletal         Back pain (prior lumbar fusion)      Arthritis (right knee s/p surgery - still problematic w/ pain):         Endocrine/Other       No diabetes        Hypothyroidism (no recent dose changes - taking liothyronine and synthroid)      Anemia (hx of anemia and thrombocytopenia)      History of blood transfusion (periop in 2021)        No malignancy      Obesity      Constitution - negative       Physical Exam    Airway Findings      Mallampati: III      TM distance: <3 FB      Neck ROM: full      Mouth opening: good      Upper Lip Bite Test: 1    Dental Findings: Negative        Comments: orthodontia wires on top/bottom teeth     Cardiovascular Findings:       Rhythm: regular      Rate: normal      No murmur, no peripheral edema    Pulmonary Findings:       Breath sounds clear to auscultation.    Abdominal Findings:       Obese    Neurological Findings:       Alert and oriented x 3    Constitutional findings:       No acute distress      Well-developed      Well-nourished       Diagnostic Tests  Hematology:   Lab Results   Component Value Date    HGB 9.1 08/27/2019    HCT 25.4 08/27/2019    PLTCT 127 08/27/2019    WBC 8.1 08/27/2019    NEUT 59 08/27/2019    ANC 4.82  08/27/2019    ALC 2.14 08/27/2019    MONA 13 08/27/2019    AMC 1.03 08/27/2019    EOSA 1 08/27/2019    ABC 0.05 08/27/2019    MCV 94.4 08/27/2019    MCH 33.9 08/27/2019    MCHC 35.9 08/27/2019    MPV 9.4 08/27/2019    RDW 14.4 08/27/2019         General Chemistry:   Lab Results   Component Value Date    NA 139 08/27/2019    K 3.9 08/27/2019    CL 105 08/27/2019    CO2 25 08/27/2019    GAP 9 08/27/2019    BUN 6 08/27/2019    CR 0.63 08/27/2019    GLU 105 08/27/2019    CA 8.4 08/27/2019    OBSCA 1.08 08/24/2019      Coagulation: No results found for: PT, PTT, INR    PAC RISK ASSESSMENTS:   *Duke Activity Status Index (DASI): 10  , Calculated METS: 3.97   (score < 25 correlates with increased risk for death, MI, and moderate to severe complications, max score 57)  *Revised Cardiac Risk Index (RCRI): 0 points=0.4% risk for major adverse cardiac events (myocardial infarction, pulmonary edema, cardiac arrest)    PAC Plan    Interview: Clinic Interview    ASA Score: 3    Anesthesia Options Discussed: General              Labs/Studies Ordered Prior to DOS:  BMP and CBC          Reviewed perioperative concerns related to untreated OSA including potential difficulty with ventilation or intubation, postoperative airway obstruction or hypoxia, increased risk of respiratory or cardiac issues, increased risk of transfer to intensive care unit or prolonged hospitalization.         Alerts

## 2022-09-14 ENCOUNTER — Encounter: Admit: 2022-09-14 | Discharge: 2022-09-14 | Payer: MEDICARE

## 2022-09-14 NOTE — Telephone Encounter
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    Referring provider: Rockwell Germany, PA    Previous neurologist: No    Imaging: MRI L-spine done in 2024 and 2021 at [facility] in [place], [place]. MRI L-spine from 2024 is in O2. Requested 2021 imaging.       EMG: 08/01/22 at [facility]    LP: No    Recent lab work: 09/04/22 at [facility]    Pt mentioned that she is having spine surgery on 10/04/22 - at [facility].    Confirmed appointment for 09/19/22 at 1:10pm.  Also confirmed that patient aware of clinic location.

## 2022-09-18 ENCOUNTER — Encounter: Admit: 2022-09-18 | Discharge: 2022-09-18 | Payer: MEDICARE

## 2022-09-19 ENCOUNTER — Encounter: Admit: 2022-09-19 | Discharge: 2022-09-19 | Payer: MEDICARE

## 2022-09-19 ENCOUNTER — Ambulatory Visit: Admit: 2022-09-19 | Discharge: 2022-09-19 | Payer: MEDICARE

## 2022-09-19 DIAGNOSIS — M2550 Pain in unspecified joint: Secondary | ICD-10-CM

## 2022-09-19 DIAGNOSIS — T148XXA Other injury of unspecified body region, initial encounter: Secondary | ICD-10-CM

## 2022-09-19 DIAGNOSIS — M4800 Spinal stenosis, site unspecified: Secondary | ICD-10-CM

## 2022-09-19 DIAGNOSIS — I890 Lymphedema, not elsewhere classified: Secondary | ICD-10-CM

## 2022-09-19 DIAGNOSIS — Z8719 Personal history of other diseases of the digestive system: Secondary | ICD-10-CM

## 2022-09-19 DIAGNOSIS — E039 Hypothyroidism, unspecified: Secondary | ICD-10-CM

## 2022-09-19 DIAGNOSIS — R32 Unspecified urinary incontinence: Secondary | ICD-10-CM

## 2022-09-19 DIAGNOSIS — I8390 Asymptomatic varicose veins of unspecified lower extremity: Secondary | ICD-10-CM

## 2022-09-19 DIAGNOSIS — R200 Anesthesia of skin: Secondary | ICD-10-CM

## 2022-09-19 DIAGNOSIS — G4733 Obstructive sleep apnea (adult) (pediatric): Secondary | ICD-10-CM

## 2022-09-19 DIAGNOSIS — M5136 Other intervertebral disc degeneration, lumbar region: Secondary | ICD-10-CM

## 2022-09-19 LAB — VITAMIN B12: VITAMIN B12: 705 pg/mL (ref 180–914)

## 2022-09-19 NOTE — Patient Instructions
#   Right foot numbness - all sides top and bottom area - we discussed how this is not fitting into nerve supply and dermatome level - we need to look beyond neuropathy and lumbosacral spine area for evaluate of this numbness - we will do MR Brain     # Incontinence - from unclear etiology - she was diagnosed with L3L4 radiculopathy right side - referral to urologist

## 2022-09-19 NOTE — Telephone Encounter
Patient has a Insurance underwriter /GYN provider she is under the care of Dr Festus Holts

## 2022-09-21 ENCOUNTER — Encounter: Admit: 2022-09-21 | Discharge: 2022-09-21 | Payer: MEDICARE

## 2022-09-21 NOTE — Telephone Encounter
MRI-Head without contrast patient will have image done at [facility]    Faxed order to radiology scheduling    [facility] fax 3 [phone]/ph [phone]  Hospital# [phone]

## 2022-10-01 ENCOUNTER — Encounter: Admit: 2022-10-01 | Discharge: 2022-10-01 | Payer: MEDICARE

## 2022-10-01 DIAGNOSIS — R200 Anesthesia of skin: Secondary | ICD-10-CM

## 2022-10-04 ENCOUNTER — Encounter: Admit: 2022-10-04 | Discharge: 2022-10-04 | Payer: MEDICARE

## 2022-10-04 DIAGNOSIS — Z8719 Personal history of other diseases of the digestive system: Secondary | ICD-10-CM

## 2022-10-04 DIAGNOSIS — I8390 Asymptomatic varicose veins of unspecified lower extremity: Secondary | ICD-10-CM

## 2022-10-04 DIAGNOSIS — E039 Hypothyroidism, unspecified: Secondary | ICD-10-CM

## 2022-10-04 DIAGNOSIS — M4800 Spinal stenosis, site unspecified: Secondary | ICD-10-CM

## 2022-10-04 DIAGNOSIS — I890 Lymphedema, not elsewhere classified: Secondary | ICD-10-CM

## 2022-10-04 DIAGNOSIS — G4733 Obstructive sleep apnea (adult) (pediatric): Secondary | ICD-10-CM

## 2022-10-04 DIAGNOSIS — T148XXA Other injury of unspecified body region, initial encounter: Secondary | ICD-10-CM

## 2022-10-04 DIAGNOSIS — M2550 Pain in unspecified joint: Secondary | ICD-10-CM

## 2022-10-04 DIAGNOSIS — M5136 Other intervertebral disc degeneration, lumbar region: Secondary | ICD-10-CM

## 2022-10-04 MED ORDER — ARTIFICIAL TEARS (PF) SINGLE DOSE DROPS GROUP
OPHTHALMIC | 0 refills | Status: DC
Start: 2022-10-04 — End: 2022-10-04

## 2022-10-04 MED ORDER — ROCURONIUM 10 MG/ML IV SOLN
INTRAVENOUS | 0 refills | Status: DC
Start: 2022-10-04 — End: 2022-10-04

## 2022-10-04 MED ORDER — PHENYLEPHRINE HCL IN 0.9% NACL 1 MG/10 ML (100 MCG/ML) IV SYRG
INTRAVENOUS | 0 refills | Status: DC
Start: 2022-10-04 — End: 2022-10-04

## 2022-10-04 MED ORDER — CEFAZOLIN 1 GRAM IJ SOLR
INTRAVENOUS | 0 refills | Status: DC
Start: 2022-10-04 — End: 2022-10-04

## 2022-10-04 MED ORDER — SUGAMMADEX 100 MG/ML IV SOLN
INTRAVENOUS | 0 refills | Status: DC
Start: 2022-10-04 — End: 2022-10-04

## 2022-10-04 MED ORDER — FENTANYL CITRATE (PF) 50 MCG/ML IJ SOLN
INTRAVENOUS | 0 refills | Status: DC
Start: 2022-10-04 — End: 2022-10-04

## 2022-10-04 MED ORDER — DEXAMETHASONE SODIUM PHOSPHATE 4 MG/ML IJ SOLN
INTRAVENOUS | 0 refills | Status: DC
Start: 2022-10-04 — End: 2022-10-04

## 2022-10-04 MED ORDER — EPHEDRINE SULFATE 50 MG/ML IV SOLN
INTRAVENOUS | 0 refills | Status: DC
Start: 2022-10-04 — End: 2022-10-04

## 2022-10-04 MED ORDER — LIDOCAINE (PF) 200 MG/10 ML (2 %) IJ SYRG
INTRAVENOUS | 0 refills | Status: DC
Start: 2022-10-04 — End: 2022-10-04

## 2022-10-04 MED ORDER — PROPOFOL INJ 10 MG/ML IV VIAL
INTRAVENOUS | 0 refills | Status: DC
Start: 2022-10-04 — End: 2022-10-04

## 2022-10-04 MED ORDER — HYDROMORPHONE (PF) 2 MG/ML IJ SYRG
INTRAVENOUS | 0 refills | Status: DC
Start: 2022-10-04 — End: 2022-10-04

## 2022-10-04 MED ORDER — ONDANSETRON HCL (PF) 4 MG/2 ML IJ SOLN
INTRAVENOUS | 0 refills | Status: DC
Start: 2022-10-04 — End: 2022-10-04

## 2022-10-04 MED ADMIN — VANCOMYCIN 5 GRAM IV SOLR [8444]: 1750 mg | INTRAVENOUS | @ 21:00:00 | Stop: 2022-10-04 | NDC 71288002575

## 2022-10-04 MED ADMIN — SODIUM CHLORIDE 0.9 % IV SOLP [27838]: 1750 mg | INTRAVENOUS | @ 21:00:00 | Stop: 2022-10-04 | NDC 00338004902

## 2022-10-04 MED ADMIN — FENTANYL CITRATE (PF) 50 MCG/ML IJ SOLN [3037]: 50 ug | INTRAVENOUS | @ 18:00:00 | Stop: 2022-10-04 | NDC 00409909412

## 2022-10-04 MED ADMIN — SODIUM CHLORIDE 0.9 % IR SOLN [11403]: 1000 mL | @ 14:00:00 | Stop: 2022-10-04 | NDC 00338004804

## 2022-10-04 MED ADMIN — SODIUM CHLORIDE 0.9 % IV SOLP [27838]: 1000 mL | INTRAVENOUS | @ 12:00:00 | Stop: 2022-10-04 | NDC 00338004904

## 2022-10-04 MED ADMIN — MAGNESIUM HYDROXIDE 400 MG/5 ML PO SUSP [79944]: 30 mL | ORAL | @ 21:00:00 | NDC 57237031403

## 2022-10-04 MED ADMIN — ACETAMINOPHEN 325 MG PO TAB [101]: 650 mg | ORAL | NDC 00904677361

## 2022-10-04 MED ADMIN — OXYCODONE 5 MG PO TAB [10814]: 5 mg | ORAL | NDC 00406055223

## 2022-10-04 MED ADMIN — METHOCARBAMOL 500 MG PO TAB [4971]: 500 mg | ORAL | @ 18:00:00 | NDC 00904705761

## 2022-10-04 MED ADMIN — SENNOSIDES-DOCUSATE SODIUM 8.6-50 MG PO TAB [40926]: 1 | ORAL | @ 21:00:00 | NDC 00536124810

## 2022-10-04 MED ADMIN — OXYCODONE 5 MG PO TAB [10814]: 5 mg | ORAL | @ 18:00:00 | Stop: 2022-10-04 | NDC 00406055223

## 2022-10-04 MED ADMIN — DOCUSATE SODIUM 100 MG PO CAP [2566]: 100 mg | ORAL | @ 21:00:00 | NDC 00904718361

## 2022-10-04 MED ADMIN — CHOLECALCIFEROL (VITAMIN D3) 25 MCG (1,000 UNIT) PO TAB [130074]: 1000 [IU] | ORAL | @ 21:00:00 | NDC 80681016900

## 2022-10-04 MED ADMIN — SODIUM CHLORIDE 0.9 % IV SOLP [27838]: 1000.000 mL | INTRAVENOUS | @ 15:00:00 | Stop: 2022-10-04 | NDC 00338004904

## 2022-10-04 MED ADMIN — CEFAZOLIN 1 GRAM IJ SOLR [1445]: 1000 mL | @ 14:00:00 | Stop: 2022-10-04 | NDC 00143992490

## 2022-10-05 ENCOUNTER — Encounter: Admit: 2022-10-05 | Discharge: 2022-10-05 | Payer: MEDICARE

## 2022-10-05 MED ADMIN — CETIRIZINE 10 MG PO TAB [77730]: 10 mg | ORAL | @ 01:00:00 | NDC 00904671761

## 2022-10-05 MED ADMIN — OXYCODONE 5 MG PO TAB [10814]: 5 mg | ORAL | @ 05:00:00 | Stop: 2022-10-06 | NDC 00406055223

## 2022-10-05 MED ADMIN — METHOCARBAMOL 500 MG PO TAB [4971]: 500 mg | ORAL | @ 20:00:00 | Stop: 2022-10-06 | NDC 00904705761

## 2022-10-05 MED ADMIN — SENNOSIDES-DOCUSATE SODIUM 8.6-50 MG PO TAB [40926]: 1 | ORAL | @ 01:00:00 | NDC 00536124810

## 2022-10-05 MED ADMIN — LEVOTHYROXINE 25 MCG PO TAB [4420]: 125 ug | ORAL | @ 14:00:00 | Stop: 2022-10-06 | NDC 00904694961

## 2022-10-05 MED ADMIN — OXYCODONE 5 MG PO TAB [10814]: 5 mg | ORAL | @ 07:00:00 | Stop: 2022-10-06 | NDC 00406055223

## 2022-10-05 MED ADMIN — ACETAMINOPHEN 325 MG PO TAB [101]: 325 mg | ORAL | @ 07:00:00 | Stop: 2022-10-06 | NDC 00904677361

## 2022-10-05 MED ADMIN — ACETAMINOPHEN 325 MG PO TAB [101]: 650 mg | ORAL | @ 22:00:00 | Stop: 2022-10-06 | NDC 00904677361

## 2022-10-05 MED ADMIN — VANCOMYCIN 1.25 GRAM IV SOLR [338455]: 1250 mg | INTRAVENOUS | @ 10:00:00 | Stop: 2022-10-05 | NDC 67457082312

## 2022-10-05 MED ADMIN — METHOCARBAMOL 500 MG PO TAB [4971]: 500 mg | ORAL | @ 05:00:00 | Stop: 2022-10-05 | NDC 00904705761

## 2022-10-05 MED ADMIN — METHOCARBAMOL 500 MG PO TAB [4971]: 500 mg | ORAL | @ 14:00:00 | Stop: 2022-10-06 | NDC 00904705761

## 2022-10-05 MED ADMIN — SODIUM CHLORIDE 0.9 % IV SOLP [27838]: 1250 mg | INTRAVENOUS | @ 10:00:00 | Stop: 2022-10-05 | NDC 00338004902

## 2022-10-05 MED ADMIN — SENNOSIDES-DOCUSATE SODIUM 8.6-50 MG PO TAB [40926]: 1 | ORAL | @ 14:00:00 | Stop: 2022-10-06 | NDC 00536124810

## 2022-10-05 MED ADMIN — OXYCODONE 5 MG PO TAB [10814]: 5 mg | ORAL | @ 22:00:00 | Stop: 2022-10-06 | NDC 00406055223

## 2022-10-05 MED ADMIN — ACETAMINOPHEN 325 MG PO TAB [101]: 325 mg | ORAL | @ 05:00:00 | Stop: 2022-10-06 | NDC 00904677361

## 2022-10-05 MED ADMIN — MAGNESIUM OXIDE 400 MG (241.3 MG MAGNESIUM) PO TAB [10491]: 200 mg | ORAL | @ 01:00:00 | NDC 10006070028

## 2022-10-05 MED ADMIN — OXYCODONE 5 MG PO TAB [10814]: 5 mg | ORAL | @ 12:00:00 | Stop: 2022-10-06 | NDC 00406055223

## 2022-10-05 MED ADMIN — DOCUSATE SODIUM 100 MG PO CAP [2566]: 100 mg | ORAL | @ 01:00:00 | NDC 00904718361

## 2022-10-05 MED ADMIN — OXYCODONE 5 MG PO TAB [10814]: 5 mg | ORAL | @ 03:00:00 | NDC 00406055223

## 2022-10-05 MED ADMIN — DOCUSATE SODIUM 100 MG PO CAP [2566]: 100 mg | ORAL | @ 14:00:00 | Stop: 2022-10-06 | NDC 00904718361

## 2022-10-05 MED ADMIN — CHOLECALCIFEROL (VITAMIN D3) 25 MCG (1,000 UNIT) PO TAB [130074]: 1000 [IU] | ORAL | @ 14:00:00 | Stop: 2022-10-06 | NDC 80681016900

## 2022-10-05 MED ADMIN — MELATONIN 5 MG PO TAB [168576]: 5 mg | ORAL | @ 03:00:00 | NDC 77333052025

## 2022-10-05 MED ADMIN — LIOTHYRONINE 5 MCG PO TAB [79163]: 10 ug | ORAL | @ 14:00:00 | Stop: 2022-10-06 | NDC 62756058988

## 2022-10-05 MED ADMIN — ACETAMINOPHEN 325 MG PO TAB [101]: 325 mg | ORAL | @ 12:00:00 | Stop: 2022-10-06 | NDC 00904677361

## 2022-10-05 MED ADMIN — LEVOTHYROXINE 75 MCG PO TAB [4422]: 125 ug | ORAL | @ 14:00:00 | Stop: 2022-10-06 | NDC 00904695161

## 2022-10-06 ENCOUNTER — Encounter: Admit: 2022-10-06 | Discharge: 2022-10-06 | Payer: MEDICARE

## 2022-10-06 DIAGNOSIS — M4800 Spinal stenosis, site unspecified: Secondary | ICD-10-CM

## 2022-10-06 DIAGNOSIS — M5136 Other intervertebral disc degeneration, lumbar region: Secondary | ICD-10-CM

## 2022-10-06 DIAGNOSIS — E039 Hypothyroidism, unspecified: Secondary | ICD-10-CM

## 2022-10-06 DIAGNOSIS — M2550 Pain in unspecified joint: Secondary | ICD-10-CM

## 2022-10-06 DIAGNOSIS — I8390 Asymptomatic varicose veins of unspecified lower extremity: Secondary | ICD-10-CM

## 2022-10-06 DIAGNOSIS — Z8719 Personal history of other diseases of the digestive system: Secondary | ICD-10-CM

## 2022-10-06 DIAGNOSIS — I890 Lymphedema, not elsewhere classified: Secondary | ICD-10-CM

## 2022-10-06 DIAGNOSIS — T148XXA Other injury of unspecified body region, initial encounter: Secondary | ICD-10-CM

## 2022-10-06 DIAGNOSIS — G4733 Obstructive sleep apnea (adult) (pediatric): Secondary | ICD-10-CM

## 2022-10-19 ENCOUNTER — Encounter: Admit: 2022-10-19 | Discharge: 2022-10-19 | Payer: MEDICARE

## 2022-11-08 ENCOUNTER — Encounter: Admit: 2022-11-08 | Discharge: 2022-11-08 | Payer: MEDICARE

## 2022-11-08 DIAGNOSIS — Z981 Arthrodesis status: Secondary | ICD-10-CM

## 2022-11-08 NOTE — Patient Instructions
It was nice to see you today.  Thank you for choosing to visit our clinic.  Your time is important, and if you had to wait today, we do apologize.  Our goal is to run exactly on time.  However, on occasion, we get behind in clinic due to unexpected patient issues.  Thank you for your patience.    General Instructions:  Scheduling:  Our scheduling phone number is [phone].  Appointment Reminders on your cell phone:  Communication preferences can be managed in MyChart to ensure you receive important appointment notifications  How to reach our office:  Please send a MyChart message to the [facility] or leave a voicemail for the nurse at [phone].  How to get a medication refill:  Please use the MyChart Refill request or contact your pharmacy directly to request medication refills.  Please allow 72 business hours for request to be completed.    Support for many chronic illnesses is available through [facility] at [email or web address] or [phone].    For help with MyChart:  please call [phone].    For questions on nights, weekends or holidays:  call the Operator at [phone], and ask for the doctor on call for Neurosurgery.    For more information on spinal conditions:  please visit [email or web address]     Again, thank you for coming in today.

## 2022-11-13 ENCOUNTER — Encounter: Admit: 2022-11-13 | Discharge: 2022-11-13 | Payer: MEDICARE

## 2022-11-13 ENCOUNTER — Ambulatory Visit: Admit: 2022-11-13 | Discharge: 2022-11-13 | Payer: MEDICARE

## 2022-11-13 DIAGNOSIS — G4733 Obstructive sleep apnea (adult) (pediatric): Secondary | ICD-10-CM

## 2022-11-13 DIAGNOSIS — M4800 Spinal stenosis, site unspecified: Secondary | ICD-10-CM

## 2022-11-13 DIAGNOSIS — M5116 Intervertebral disc disorders with radiculopathy, lumbar region: Secondary | ICD-10-CM

## 2022-11-13 DIAGNOSIS — Z981 Arthrodesis status: Secondary | ICD-10-CM

## 2022-11-13 DIAGNOSIS — I8390 Asymptomatic varicose veins of unspecified lower extremity: Secondary | ICD-10-CM

## 2022-11-13 DIAGNOSIS — Z8719 Personal history of other diseases of the digestive system: Secondary | ICD-10-CM

## 2022-11-13 DIAGNOSIS — M5136 Other intervertebral disc degeneration, lumbar region: Secondary | ICD-10-CM

## 2022-11-13 DIAGNOSIS — T148XXA Other injury of unspecified body region, initial encounter: Secondary | ICD-10-CM

## 2022-11-13 DIAGNOSIS — I890 Lymphedema, not elsewhere classified: Secondary | ICD-10-CM

## 2022-11-13 DIAGNOSIS — E039 Hypothyroidism, unspecified: Secondary | ICD-10-CM

## 2022-11-13 DIAGNOSIS — M2550 Pain in unspecified joint: Secondary | ICD-10-CM

## 2022-11-13 NOTE — Progress Notes
Chelsea Roach is a 72 y.o. female with a history of low back pain, right leg/foot numbness and tingling as well as incontinence status post L2-3 MIS decompression and L5-S1 microdiscectomy here for follow-up at 6 weeks postop  Overall doing very well with significant improvement in her back pain  She also reports some improvement in her leg symptoms.  However, she does have some persistent numbness and tingling in her right foot  With respect to her incontinence, this is improved as well.  Overall she is doing well after the surgery.  Given that her incontinence is improved after an MIS decompression, she may ultimately benefit from a more extensive laminectomy and extension of her fusion should her symptoms not improve meaningfully over the next several months.  We will start her on physical therapy  Incision well-healed  She will return to see me in clinic in 3 months

## 2023-02-07 ENCOUNTER — Encounter: Admit: 2023-02-07 | Discharge: 2023-02-07 | Payer: MEDICARE

## 2023-02-07 DIAGNOSIS — Z981 Arthrodesis status: Secondary | ICD-10-CM

## 2023-02-07 DIAGNOSIS — M5116 Intervertebral disc disorders with radiculopathy, lumbar region: Secondary | ICD-10-CM

## 2023-02-07 NOTE — Patient Instructions
It was nice to see you today.  Thank you for choosing to visit our clinic.  Your time is important, and if you had to wait today, we do apologize.  Our goal is to run exactly on time.  However, on occasion, we get behind in clinic due to unexpected patient issues.  Thank you for your patience.    General Instructions:  Scheduling:  Our scheduling phone number is [phone].  Appointment Reminders on your cell phone:  Communication preferences can be managed in MyChart to ensure you receive important appointment notifications  How to reach our office:  Please send a MyChart message to the Spine Center or leave a voicemail for the nurse at [phone].  How to get a medication refill:  Please use the MyChart Refill request or contact your pharmacy directly to request medication refills.  Please allow 72 business hours for request to be completed.    Support for many chronic illnesses is available through [employer] at [email or web address] or [phone].    For help with MyChart:  please call [phone].    For questions on nights, weekends or holidays:  call the Operator at [phone], and ask for the doctor on call for Neurosurgery.    For more information on spinal conditions:  please visit [email or web address]     Again, thank you for coming in today.

## 2023-02-19 ENCOUNTER — Ambulatory Visit: Admit: 2023-02-19 | Discharge: 2023-02-19 | Payer: MEDICARE

## 2023-02-19 ENCOUNTER — Encounter: Admit: 2023-02-19 | Discharge: 2023-02-19 | Payer: MEDICARE

## 2023-02-19 DIAGNOSIS — M4316 Spondylolisthesis, lumbar region: Secondary | ICD-10-CM

## 2023-02-19 DIAGNOSIS — I890 Lymphedema, not elsewhere classified: Secondary | ICD-10-CM

## 2023-02-19 DIAGNOSIS — M51369 Degenerative disc disease, lumbar: Secondary | ICD-10-CM

## 2023-02-19 DIAGNOSIS — M5116 Intervertebral disc disorders with radiculopathy, lumbar region: Secondary | ICD-10-CM

## 2023-02-19 DIAGNOSIS — M4800 Spinal stenosis, site unspecified: Secondary | ICD-10-CM

## 2023-02-19 DIAGNOSIS — T148XXA Other injury of unspecified body region, initial encounter: Secondary | ICD-10-CM

## 2023-02-19 DIAGNOSIS — Z8719 Personal history of other diseases of the digestive system: Secondary | ICD-10-CM

## 2023-02-19 DIAGNOSIS — G4733 Obstructive sleep apnea (adult) (pediatric): Secondary | ICD-10-CM

## 2023-02-19 DIAGNOSIS — M2550 Pain in unspecified joint: Secondary | ICD-10-CM

## 2023-02-19 DIAGNOSIS — Z981 Arthrodesis status: Secondary | ICD-10-CM

## 2023-02-19 DIAGNOSIS — E039 Hypothyroidism, unspecified: Secondary | ICD-10-CM

## 2023-02-19 DIAGNOSIS — I8390 Asymptomatic varicose veins of unspecified lower extremity: Secondary | ICD-10-CM

## 2023-02-19 NOTE — Progress Notes
Date of Service: 02/19/2023         Chief complaint    Chief Complaint   Patient presents with    Lower Back - Follow Up     3mos FUV       HPI  Chelsea Roach is a 72 y.o. female who presents with a history of morbid obesity, low back pain, right foot paresthesias and urinary incontinence here for follow-up.  With respect to her back pain, she is doing relatively well.  Her main concerns are twofold.  1, she continues to have some paresthesias and numbness in her right foot.  This is below her ankle.  2, she has some urinary incontinence.  She reports that both of the symptoms improved initially after her decompression surgeries.  However over the last several months, they have returned.  She is quite distressed by her symptoms.  She has seen urology and gynecology in the past.  Their most recent recommendation was for a bladder stimulator.  But she is not interested in that.                  Oswestry: Oswestry Total Score:: 40        PMH    Past Medical History:   Diagnosis Date    Acquired hypothyroidism     Degenerative disc disease, lumbar     History of dental problems     attached dental work, not sure what kind    Joint pain     Lymphedema     Nerve injury     OSA (obstructive sleep apnea)     Spinal stenosis     Varicose veins            ROS  Review of Systems   Review of Systems   All other systems reviewed and are negative.      FH    Family History   Problem Relation Name Age of Onset    Cancer-Breast Mother Darral Dash     Cancer Mother IT trainer      Social History     Socioeconomic History    Marital status: Single   Tobacco Use    Smoking status: Never    Smokeless tobacco: Never   Vaping Use    Vaping status: Never Used   Substance and Sexual Activity    Alcohol use: Not Currently     Alcohol/week: 5.0 standard drinks of alcohol     Types: 5 Glasses of wine per week    Drug use: Never    Sexual activity: Not Currently     Partners: Male     Birth control/protection: Post-menopausal SH    Surgical History:   Procedure Laterality Date    CESAREAN SECTION  1991    HX KNEE REPLACMENT Right 06/2015    REVISION TOTAL KNEE ARTHROPLASTY Right 06/2018    LUMBAR 3 LUMBAR 4 AND LUMBAR 4 LUMBAR 5 TRANSFORAMINAL LUMBAR INTERBODY FUSION, OPEN AND LUMBAR 3-5 FUSION N/A 08/24/2019    Performed by Jobe Marker, MD at [facility]    22585--ARTHRODESIS ANTERIOR INTERBODY WITH MINIMAL DISCECTOMY - EACH ADDITIONAL INTERSPACE N/A 08/24/2019    Performed by Jobe Marker, MD at [facility]    22840--POSTERIOR NON-SEGMENTAL INSTRUMENTATION SPINE N/A 08/24/2019    Performed by Jobe Marker, MD at [facility]    22853--INSERTION INTERBODY BIOMECHANICAL DEVICE WITH/ WITHOUT INTEGRAL ANTERIOR INSTRUMENTATION FOR DEVICE ANCHORING TO INTERVERTEBRAL DISC SPACE IN CONJUNCTION WITH INTERBODY ARTHRODESIS - EACH INTERSPACE  N/A 08/24/2019    Performed by Jobe Marker, MD at [facility]    22842--POSTERIOR SEGMENTAL INSTRUMENTATION - 3 TO 6 VERTEBRAL SEGMENTS N/A 08/24/2019    Performed by Jobe Marker, MD at [facility]    ALLOGRAFT/ MORSELIZED/ PLACEMENT OSTEOPROMOTIVE MATERIAL - SPINE SURGERY ONLY N/A 08/24/2019    Performed by Jobe Marker, MD at [facility]    O-ARM/ STEALTH NAVIGATION STEREOTACTIC COMPUTER-ASSISTED PROCEDURE - SPINAL N/A 08/24/2019    Performed by Jobe Marker, MD at [facility]    ARTHRODESIS POSTERIOR/ POSTEROLATERAL WITH POSTERIOR INTERBODY WITH LAMINECTOMY/ DISCECTOMY - SINGLE INTERSPACE AND SEGMENT - LUMBAR N/A 08/24/2019    Performed by Jobe Marker, MD at [facility]    ARTHRODESIS POSTERIOR/ POSTEROLATERAL WITH POSTERIOR INTERBODY WITH LAMINECTOMY/ DISCECTOMY - SINGLE INTERSPACE AND SEGMENT - EACH ADDITIONAL INTERSPACE AND SEGMENT N/A 08/24/2019    Performed by Jobe Marker, MD at [facility]    LAMINECTOMY/ FACETECTOMY/ FORAMINOTOMY WITH DECOMPRESSION - 1 VERTEBRAL SEGMENT - CERVICAL N/A 08/24/2019    Performed by Jobe Marker, MD at [facility]    LAMINECTOMY/ FACETECTOMY/ FORAMINOTOMY WITH DECOMPRESSION - 1 VERTEBRAL SEGMENT - EACH ADDITIONAL CERVICAL/ THORACIC/ LUMBAR SEGMENT N/A 08/24/2019    Performed by Jobe Marker, MD at [facility]    AUTOGRAFT - SPINE SURGERY ONLY N/A 08/24/2019    Performed by Jobe Marker, MD at [facility]    FUSION SPINE POSTERIOR - LUMBAR - EACH ADDITIONAL SEGMENT N/A 08/24/2019    Performed by Jobe Marker, MD at [facility]    63047-LAMINECTOMY/ FACETECTOMY/ FORAMINOTOMY WITH DECOMPRESSION - 1 VERTEBRAL SEGMENT - LUMBAR Lumbar 2-Lumbar 3 Open Laminectomy, Lumbar 5/Sacral 1 Right Microdiscectomy N/A 10/04/2022    Performed by Jobe Marker, MD at [facility]    LAMINOTOMY WITH DECOMPRESSION OF NERVE ROOT/ EXCISION HERNIATED DISC - 1 INTERSPACE - LUMBAR Right 10/04/2022    Performed by Jobe Marker, MD at [facility]    LAMINECTOMY/ FACETECTOMY/ FORAMINOTOMY WITH DECOMPRESSION - 1 VERTEBRAL SEGMENT - EACH ADDITIONAL CERVICAL/ THORACIC/ LUMBAR SEGMENT N/A 10/04/2022    Performed by Jobe Marker, MD at [facility]    CHOLECYSTECTOMY      HX KNEE ARTHROSCOPY Right     debridement & synovectomy    KNEE SURGERY         Meds     acetaminophen (TYLENOL) 325 mg tablet Take two tablets by mouth every 4 hours as needed for Pain. Indications: pain    cetirizine (ZYRTEC) 10 mg tablet Take one tablet by mouth at bedtime daily.    cholecalciferol (VITAMIN D-3) 5000 unit tablet Take 1,000 Units by mouth daily.    fish oil /omega-3 fatty acids (SEA-OMEGA) 340/1000 mg capsule Take one capsule by mouth daily.    ibuprofen (ADVIL) 200 mg tablet Take one tablet by mouth every 6 hours as needed for Pain. Take with food.    liothyronine (CYTOMEL) 5 mcg tab Take two tablets by mouth daily.    Magnesium 200 mg tab Take one tablet by mouth at bedtime daily.    melatonin 5 mg tab Take one tablet by mouth at bedtime daily.    methocarbamoL (ROBAXIN) 500 mg tablet Take one tablet by mouth three times daily as needed for Spasms. Indications: muscle spasm multivitamin (ONE-A-DAY) tablet Take one tablet by mouth daily.    oxyCODONE (ROXICODONE) 5 mg tablet Take one tablet to two tablets by mouth every 4 hours as needed for Pain.  Indications: pain    pumpkin seed extract/soy germ (AZO BLADDER CONTROL PO) Take 1 tablet by mouth daily.    sennosides-docusate sodium (SENOKOT-S) 8.6/50 mg tablet Take one tablet by mouth twice daily as needed. Indications: constipation    SYNTHROID 125 mcg tablet Take one tablet by mouth daily.    Syringe with Needle (Disp) 3 mL 25 gauge x 1 syrg .MEDSUPPLY    thiamine (VITAMIN B-1) 100 mg/mL injection INJECT 100MG  INTRAMUSCULARLY EVERY 3 WEEKS    Zinc Acetate (Oral) 50 mg (zinc) cap Take one capsule by mouth daily.       Allergies    Allergies   Allergen Reactions    Cephalexin ANAPHYLAXIS, SHORTNESS OF BREATH, SEE COMMENTS and EDEMA     Patient states tightening in throat - tolerated cefazolin in 2021    Penicillins ANAPHYLAXIS, SHORTNESS OF BREATH, SEE COMMENTS and EDEMA     Patient states tightening in throat - tolerated cefazolin in 2021    Chlorhexidine RASH    Peanut RASH and SEE COMMENTS     Skin breaks out    Wheat RASH, MENTAL STATUS CHANGES and SEE COMMENTS     Ear infections stopped once pt got off of wheat    Pork Derived (Porcine) DIZZINESS     Light-headedness when eating pork    Shellfish Containing Products NAUSEA AND VOMITING         Exam  Physical Exam   Constitutional: she is oriented to person, place, and time. she appears well-developed and well-nourished.    Head: Normocephalic and atraumatic.    Eyes: Conjunctivae and EOM are normal.    Pulmonary/Chest: Effort normal. No respiratory distress.   Neurological: she is alert and oriented to person, place, and time. No cranial nerve deficit or sensory deficit. she exhibits normal muscle tone. Coordination normal.   Skin: Skin is warm and dry.   Psychiatric: she has a normal mood and affect. her behavior is normal. Judgment and thought content normal.    Vitals reviewed.  A&Ox3  FS, TML, EOMI      D B Tr Hg IO  R 5/5 5/5 5/5 5/5 5/5  L 5/5 5/5 5/5 5/5 5/5        HF KE DF PF EHL  R  5/5 5/5 5/5 5/5 5/5  L  5/5 5/5 5/5 5/5 5/5      Vitals:   Vitals:    02/19/23 1355   BP: 135/85   BP Source: Arm, Right Lower   Pulse: 97   SpO2: 96%   PainSc: Four   Weight: 120.2 kg (265 lb)   Height: 170.2 cm (5' 7)     Body mass index is 41.5 kg/m?.      Imaging:   I independently reviewed the patient's imaging findings:  X-rays reviewed: Stable hardware position     Assessment/Plan  72 year old female with a history of two-level TLIF at L3-4 and L4-5 with an L2-3 laminectomy and L5-S1 microdiscectomy here for follow-up  She continues to have some significant urinary incontinence.  This improved transiently after her laminectomy surgery.  However, the symptoms have returned.  Will obtain a new MRI of her lumbar spine to evaluate for continued stenosis  Discussed with her in brief possible surgical intervention.  This will entail an extension of her fusion cranially and caudally.  Will review her treatment options after her new MRI.      Orders Placed This Encounter    MRI L-SPINE WO CONTRAST  Standing Status:   Future     Standing Expiration Date:   02/19/2024     Order Specific Question:   Reason for exam:(Sign,Symptom,Reason)     Answer:   hx of lumbar fusion w progressive symptoms, eval for stenosis above and R L5/S1 disc herniation     Order Specific Question:   Is patient allergic/sensitive to contrast media?     Answer:   No     Order Specific Question:   Does the patient have implantable devices/embedded metal?     Answer:   No     Order Specific Question:   Implement Reflex Order Protocol: Can be modified, changed, or replaced including contrast studies. Changes can be determined by reason for exam and per protocol.     Answer:   Yes     Order Specific Question:   Release to patient     Answer:   Immediate                       No orders of the defined types were placed in this encounter.                             Please note that documentation of records were done during a busy neurosurgical clinic. Attempts have been made to review the document for any errors. Please excuse for brevity and typographical errors.

## 2023-03-18 ENCOUNTER — Encounter: Admit: 2023-03-18 | Discharge: 2023-03-18 | Payer: MEDICARE

## 2023-03-18 NOTE — Progress Notes
MRI order faxed to [facility] fax# [phone]

## 2023-03-21 ENCOUNTER — Encounter: Admit: 2023-03-21 | Discharge: 2023-03-22 | Payer: MEDICARE

## 2023-05-03 NOTE — Patient Instructions
 It was nice to see you today.  Thank you for choosing to visit our clinic.  Your time is important, and if you had to wait today, we do apologize.  Our goal is to run exactly on time.  However, on occasion, we get behind in clinic due to unexpected patient issues.  Thank you for your patience.    General Instructions:  Scheduling:  Our scheduling phone number is [phone].  Appointment Reminders on your cell phone:  Communication preferences can be managed in MyChart to ensure you receive important appointment notifications  How to reach our office:  Please send a MyChart message to the Spine Center or leave a voicemail for the nurse at [phone].  How to get a medication refill:  Please use the MyChart Refill request or contact your pharmacy directly to request medication refills.  Please allow 72 business hours for request to be completed.    Support for many chronic illnesses is available through [employer] at [email or web address] or [phone].    For help with MyChart:  please call [phone].    For questions on nights, weekends or holidays:  call the Operator at [phone], and ask for the doctor on call for Neurosurgery.    For more information on spinal conditions:  please visit [email or web address]     Again, thank you for coming in today.

## 2023-05-08 ENCOUNTER — Encounter: Admit: 2023-05-08 | Discharge: 2023-05-08 | Payer: MEDICARE

## 2023-05-14 ENCOUNTER — Encounter: Admit: 2023-05-14 | Discharge: 2023-05-14 | Payer: MEDICARE

## 2023-05-14 ENCOUNTER — Ambulatory Visit: Admit: 2023-05-14 | Discharge: 2023-05-15 | Payer: MEDICARE

## 2023-05-14 DIAGNOSIS — M5410 Radiculopathy, site unspecified: Secondary | ICD-10-CM

## 2023-05-14 DIAGNOSIS — M4316 Spondylolisthesis, lumbar region: Secondary | ICD-10-CM

## 2023-05-14 NOTE — Progress Notes
Date of Service: 05/14/2023         Chief complaint    Chief Complaint   Patient presents with    Lower Back - Follow Up     FUV       HPI  Chelsea Roach is a 73 y.o. female who presents with a history of morbid obesity, low back pain, right foot paresthesias and urinary incontinence here for follow-up.  With respect to her back pain, she is doing relatively well.  Her main concerns are twofold.  1, she continues to have some paresthesias and numbness in her right foot.  This is below her ankle.  2, she has some urinary incontinence.  She reports that both of the symptoms improved initially after her decompression surgeries.  However over the last several months, they have returned.  She is quite distressed by her symptoms.  She has seen urology and gynecology in the past.  Their most recent recommendation was for a bladder stimulator.  But she is not interested in that.    She continues to have symptoms of the right foot numbness.  This involves the ankle and bottom of her foot.  In addition, she has some continued urinary incontinence.                  Oswestry:          PMH    Past Medical History:    Acquired hypothyroidism    Degenerative disc disease, lumbar    History of dental problems    Joint pain    Lymphedema    Nerve injury    OSA (obstructive sleep apnea)    Spinal stenosis    Varicose veins           ROS  Review of Systems   Review of Systems   All other systems reviewed and are negative.      FH    Family History   Problem Relation Name Age of Onset    Cancer-Breast Mother Darral Dash     Cancer Mother IT trainer      Social History     Socioeconomic History    Marital status: Single   Tobacco Use    Smoking status: Never    Smokeless tobacco: Never   Vaping Use    Vaping status: Never Used   Substance and Sexual Activity    Alcohol use: Not Currently     Alcohol/week: 5.0 standard drinks of alcohol     Types: 5 Glasses of wine per week    Drug use: Never    Sexual activity: Not Currently     Partners: Male     Birth control/protection: Post-menopausal           SH    Surgical History:   Procedure Laterality Date    CESAREAN SECTION  1991    HX KNEE REPLACMENT Right 06/2015    REVISION TOTAL KNEE ARTHROPLASTY Right 06/2018    LUMBAR 3 LUMBAR 4 AND LUMBAR 4 LUMBAR 5 TRANSFORAMINAL LUMBAR INTERBODY FUSION, OPEN AND LUMBAR 3-5 FUSION N/A 08/24/2019    Performed by Jobe Marker, [facility] at [facility]    22585--ARTHRODESIS ANTERIOR INTERBODY WITH MINIMAL DISCECTOMY - EACH ADDITIONAL INTERSPACE N/A 08/24/2019    Performed by Jobe Marker, [facility]    22840--POSTERIOR NON-SEGMENTAL INSTRUMENTATION SPINE N/A 08/24/2019    Performed by Jobe Marker, [facility]    22853--INSERTION INTERBODY BIOMECHANICAL DEVICE WITH/ WITHOUT INTEGRAL ANTERIOR  INSTRUMENTATION FOR DEVICE ANCHORING TO INTERVERTEBRAL DISC SPACE IN CONJUNCTION WITH INTERBODY ARTHRODESIS - EACH INTERSPACE N/A 08/24/2019    Performed by Jobe Marker, [facility]    22842--POSTERIOR SEGMENTAL INSTRUMENTATION - 3 TO 6 VERTEBRAL SEGMENTS N/A 08/24/2019    Performed by Jobe [facility]er, [facility]    ALLOGRAFT/ MORSELIZED/ PLACEMENT OSTEOPROMOTIVE MATERIAL - SPINE SURGERY ONLY N/A 08/24/2019    Performed by Jobe Marker, [facility]    O-ARM/ STEALTH NAVIGATION STEREOTACTIC COMPUTER-ASSISTED PROCEDURE - SPINAL N/A 08/24/2019    Performed by Jobe Marker, [facility]    ARTHRODESIS POSTERIOR/ POSTEROLATERAL WITH POSTERIOR INTERBODY WITH LAMINECTOMY/ DISCECTOMY - SINGLE INTERSPACE AND SEGMENT - LUMBAR N/A 08/24/2019    Performed by Jobe Marker, [facility]    ARTHRODESIS POSTERIOR/ POSTEROLATERAL WITH POSTERIOR INTERBODY WITH LAMINECTOMY/ DISCECTOMY - SINGLE INTERSPACE AND SEGMENT - EACH ADDITIONAL INTERSPACE AND SEGMENT N/A 08/24/2019    Performed by Jobe Marker, [facility]    LAMINECTOMY/ FACETECTOMY/ FORAMINOTOMY WITH DECOMPRESSION - 1 VERTEBRAL SEGMENT - CERVICAL N/A 08/24/2019    Performed by Jobe [facility], [facility]    LAMINECTOMY/ FACETECTOMY/ FORAMINOTOMY WITH DECOMPRESSION - 1 VERTEBRAL SEGMENT - EACH ADDITIONAL CERVICAL/ THORACIC/ LUMBAR SEGMENT N/A 08/24/2019    Performed by Jobe [facility], [facility]    AUTOGRAFT - SPINE SURGERY ONLY N/A 08/24/2019    Performed by Jobe [facility], [facility]    FUSION SPINE POSTERIOR - LUMBAR - EACH ADDITIONAL SEGMENT N/A 08/24/2019    Performed by J[facility], [facility]    63047-LAMINECTOMY/ FACETECTOMY/ FORAMINOTOMY WITH DECOMPRESSION - 1 VERTEBRAL SEGMENT - LUMBAR Lumbar 2-Lumbar 3 Open Laminectomy, Lumbar 5/Sacral 1 Right Microdiscectomy N/A 10/04/2022    Performed by Jobe Marker, [facility]    LAMINOTOMY WITH DECOMPRESSION OF NERVE ROOT/ EXCISION HERNIATED DISC - 1 INTERSPACE - LUMBAR Right 10/04/2022    Performed by Jobe Marker, [facility]    LAMINECTOMY/ FACETECTOMY/ FORAMINOTOMY WITH DECOMPRESSION - 1 VERTEBRAL SEGMENT - EACH ADDITIONAL CERVICAL/ THORACIC/ LUMBAR SEGMENT N/A 10/04/2022    Performed by Jobe Marker, [facility]    CHOLECYSTECTOMY      HX KNEE ARTHROSCOPY Right     debridement & synovectomy    KNEE SURGERY         Meds     acetaminophen (TYLENOL) 325 mg tablet Take two tablets by mouth every 4 hours as needed for Pain. Indications: pain    cetirizine (ZYRTEC) 10 mg tablet Take one tablet by mouth at bedtime daily.    cholecalciferol (VITAMIN D-3) 5000 unit tablet Take 1,000 Units by mouth daily.    fish oil /omega-3 fatty acids (SEA-OMEGA) 340/1000 mg capsule Take one capsule by mouth daily.    ibuprofen (ADVIL) 200 mg tablet Take one tablet by mouth every 6 hours as needed for Pain. Take with food.    liothyronine (CYTOMEL) 5 mcg tab Take two tablets by mouth daily.    Magnesium 200 mg tab Take one tablet by mouth at bedtime daily.    melatonin 5 mg tab Take one tablet by mouth at bedtime daily.    methocarbamoL (ROBAXIN) 500 mg tablet Take one tablet by mouth three times daily as needed for Spasms. Indications: muscle spasm    multivitamin (ONE-A-DAY) tablet Take one tablet by mouth daily.    oxyCODONE (  ROXICODONE) 5 mg tablet Take one tablet to two tablets by mouth every 4 hours as needed for Pain. Indications: pain    pumpkin seed extract/soy germ (AZO BLADDER CONTROL PO) Take 1 tablet by mouth daily.    sennosides-docusate sodium (SENOKOT-S) 8.6/50 mg tablet Take one tablet by mouth twice daily as needed. Indications: constipation    SYNTHROID 125 mcg tablet Take one tablet by mouth daily.    Syringe with Needle (Disp) 3 mL 25 gauge x 1 syrg .MEDSUPPLY    thiamine (VITAMIN B-1) 100 mg/mL injection INJECT 100MG  INTRAMUSCULARLY EVERY 3 WEEKS    Zinc Acetate (Oral) 50 mg (zinc) cap Take one capsule by mouth daily.       Allergies    Allergies   Allergen Reactions    Cephalexin ANAPHYLAXIS, SHORTNESS OF BREATH, SEE COMMENTS and EDEMA     Patient states tightening in throat - tolerated cefazolin in 2021    Penicillins ANAPHYLAXIS, SHORTNESS OF BREATH, SEE COMMENTS and EDEMA     Patient states tightening in throat - tolerated cefazolin in 2021    Chlorhexidine RASH    Peanut RASH and SEE COMMENTS     Skin breaks out    Wheat RASH, MENTAL STATUS CHANGES and SEE COMMENTS     Ear infections stopped once pt got off of wheat    Pork Derived (Porcine) DIZZINESS     Light-headedness when eating pork    Shellfish Containing Products NAUSEA AND VOMITING         Exam  Physical Exam   Awake and alert  No acute distress  Moves all extremities strongly and symmetrically  Rest of exam deferred due to telehealth    Vitals:   There were no vitals filed for this visit.    There is no height or weight on file to calculate BMI.      Imaging:   I independently reviewed the patient's imaging findings:  X-rays reviewed: Stable hardware position  MRI of lumbar spine reviewed notable for moderate central stenosis above her prior L3-4 TLIF.  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  She does not have any significant stenosis to account for her urinary incontinence.  She also does not have any significant foraminal stenosis to account for the numbness in her ankle and foot.  As such, I do not have any surgical interventions to help her at this time.  I did ask her extensively about her previous symptoms.  She states that she has not gotten better after any surgical intervention with respect to the urinary incontinence or the foot pain.   She will return to see me in clinic in 6 months for symptom check which will be approximately 1 year after her most recent surgery                           No orders of the defined types were placed in this encounter.  Please note that documentation of records were done during a busy neurosurgical clinic. Attempts have been made to review the document for any errors. Please excuse for brevity and typographical errors.

## 2023-06-07 ENCOUNTER — Encounter: Admit: 2023-06-07 | Discharge: 2023-06-07 | Payer: MEDICARE

## 2023-11-05 ENCOUNTER — Encounter: Admit: 2023-11-05 | Discharge: 2023-11-05 | Payer: MEDICARE

## 2023-11-05 DIAGNOSIS — Z981 Arthrodesis status: Principal | ICD-10-CM

## 2023-11-12 ENCOUNTER — Encounter: Admit: 2023-11-12 | Discharge: 2023-11-12 | Payer: MEDICARE
